# Patient Record
Sex: Female | Born: 1955 | Race: Black or African American | Hispanic: No | Marital: Married | Smoking: Never smoker
Health system: Southern US, Community
[De-identification: ages and names within clinical notes are randomized; demographics above are authoritative.]

## PROBLEM LIST (undated history)

## (undated) DIAGNOSIS — K219 Gastro-esophageal reflux disease without esophagitis: Secondary | ICD-10-CM

## (undated) DIAGNOSIS — R51 Headache: Secondary | ICD-10-CM

## (undated) DIAGNOSIS — R519 Headache, unspecified: Secondary | ICD-10-CM

## (undated) DIAGNOSIS — T8859XA Other complications of anesthesia, initial encounter: Secondary | ICD-10-CM

## (undated) DIAGNOSIS — T4145XA Adverse effect of unspecified anesthetic, initial encounter: Secondary | ICD-10-CM

## (undated) HISTORY — PX: APPENDECTOMY: SHX54

## (undated) HISTORY — PX: BREAST EXCISIONAL BIOPSY: SUR124

## (undated) HISTORY — PX: OTHER SURGICAL HISTORY: SHX169

## (undated) HISTORY — PX: PELVIC LAPAROSCOPY: SHX162

## (undated) HISTORY — PX: ECTOPIC PREGNANCY SURGERY: SHX613

## (undated) HISTORY — PX: FOOT SURGERY: SHX648

## (undated) HISTORY — PX: WRIST SURGERY: SHX841

---

## 1997-11-30 ENCOUNTER — Other Ambulatory Visit: Admission: RE | Admit: 1997-11-30 | Discharge: 1997-11-30 | Payer: Self-pay | Admitting: Gynecology

## 1998-08-18 ENCOUNTER — Other Ambulatory Visit: Admission: RE | Admit: 1998-08-18 | Discharge: 1998-08-18 | Payer: Self-pay | Admitting: Surgery

## 1999-08-06 ENCOUNTER — Encounter: Admission: RE | Admit: 1999-08-06 | Discharge: 1999-08-06 | Payer: Self-pay | Admitting: Surgery

## 1999-08-06 ENCOUNTER — Encounter: Payer: Self-pay | Admitting: Surgery

## 1999-09-20 ENCOUNTER — Other Ambulatory Visit: Admission: RE | Admit: 1999-09-20 | Discharge: 1999-09-20 | Payer: Self-pay | Admitting: Gynecology

## 2000-03-19 ENCOUNTER — Encounter: Admission: RE | Admit: 2000-03-19 | Discharge: 2000-03-19 | Payer: Self-pay | Admitting: Internal Medicine

## 2000-03-19 ENCOUNTER — Encounter: Payer: Self-pay | Admitting: Internal Medicine

## 2002-03-30 ENCOUNTER — Encounter: Admission: RE | Admit: 2002-03-30 | Discharge: 2002-03-30 | Payer: Self-pay | Admitting: Internal Medicine

## 2002-03-30 ENCOUNTER — Encounter: Payer: Self-pay | Admitting: Internal Medicine

## 2002-04-05 ENCOUNTER — Other Ambulatory Visit: Admission: RE | Admit: 2002-04-05 | Discharge: 2002-04-05 | Payer: Self-pay | Admitting: Gynecology

## 2002-05-04 ENCOUNTER — Encounter: Admission: RE | Admit: 2002-05-04 | Discharge: 2002-08-02 | Payer: Self-pay | Admitting: Internal Medicine

## 2002-06-24 ENCOUNTER — Encounter: Payer: Self-pay | Admitting: Internal Medicine

## 2002-06-24 ENCOUNTER — Encounter: Admission: RE | Admit: 2002-06-24 | Discharge: 2002-06-24 | Payer: Self-pay | Admitting: Internal Medicine

## 2003-01-15 HISTORY — PX: BREAST BIOPSY: SHX20

## 2004-05-17 ENCOUNTER — Other Ambulatory Visit: Admission: RE | Admit: 2004-05-17 | Discharge: 2004-05-17 | Payer: Self-pay | Admitting: Internal Medicine

## 2004-05-30 ENCOUNTER — Encounter: Admission: RE | Admit: 2004-05-30 | Discharge: 2004-05-30 | Payer: Self-pay | Admitting: Internal Medicine

## 2006-06-05 ENCOUNTER — Encounter: Admission: RE | Admit: 2006-06-05 | Discharge: 2006-06-05 | Payer: Self-pay | Admitting: Internal Medicine

## 2007-05-01 ENCOUNTER — Other Ambulatory Visit: Admission: RE | Admit: 2007-05-01 | Discharge: 2007-05-01 | Payer: Self-pay | Admitting: Gynecology

## 2008-12-27 ENCOUNTER — Encounter: Admission: RE | Admit: 2008-12-27 | Discharge: 2008-12-27 | Payer: Self-pay | Admitting: Internal Medicine

## 2009-01-10 ENCOUNTER — Ambulatory Visit: Payer: Self-pay | Admitting: Gynecology

## 2009-01-10 ENCOUNTER — Other Ambulatory Visit: Admission: RE | Admit: 2009-01-10 | Discharge: 2009-01-10 | Payer: Self-pay | Admitting: Gynecology

## 2010-02-20 ENCOUNTER — Other Ambulatory Visit: Payer: Self-pay | Admitting: Internal Medicine

## 2010-02-20 DIAGNOSIS — M7120 Synovial cyst of popliteal space [Baker], unspecified knee: Secondary | ICD-10-CM

## 2010-02-21 ENCOUNTER — Ambulatory Visit
Admission: RE | Admit: 2010-02-21 | Discharge: 2010-02-21 | Disposition: A | Discharge status: Home / Self Care | Payer: BC Managed Care – PPO | Source: Ambulatory Visit | Attending: Internal Medicine | Admitting: Internal Medicine

## 2010-02-21 DIAGNOSIS — M7120 Synovial cyst of popliteal space [Baker], unspecified knee: Secondary | ICD-10-CM

## 2010-04-23 ENCOUNTER — Other Ambulatory Visit: Payer: Self-pay | Admitting: Internal Medicine

## 2010-04-23 DIAGNOSIS — Z1231 Encounter for screening mammogram for malignant neoplasm of breast: Secondary | ICD-10-CM

## 2010-04-25 ENCOUNTER — Ambulatory Visit
Admission: RE | Admit: 2010-04-25 | Discharge: 2010-04-25 | Disposition: A | Discharge status: Home / Self Care | Payer: BC Managed Care – PPO | Source: Ambulatory Visit | Attending: Internal Medicine | Admitting: Internal Medicine

## 2010-04-25 DIAGNOSIS — Z1231 Encounter for screening mammogram for malignant neoplasm of breast: Secondary | ICD-10-CM

## 2011-07-24 ENCOUNTER — Encounter: Payer: Self-pay | Admitting: *Deleted

## 2011-07-25 ENCOUNTER — Telehealth: Payer: Self-pay

## 2011-07-25 ENCOUNTER — Encounter: Payer: Self-pay | Admitting: Gynecology

## 2011-07-25 ENCOUNTER — Ambulatory Visit (INDEPENDENT_AMBULATORY_CARE_PROVIDER_SITE_OTHER): Payer: BC Managed Care – PPO | Admitting: Gynecology

## 2011-07-25 ENCOUNTER — Other Ambulatory Visit (HOSPITAL_COMMUNITY)
Admission: RE | Admit: 2011-07-25 | Discharge: 2011-07-25 | Disposition: A | Discharge status: Home / Self Care | Payer: BC Managed Care – PPO | Source: Ambulatory Visit | Attending: Gynecology | Admitting: Gynecology

## 2011-07-25 ENCOUNTER — Other Ambulatory Visit: Payer: Self-pay | Admitting: Gynecology

## 2011-07-25 VITALS — BP 136/80 | Ht 68.0 in | Wt 187.0 lb

## 2011-07-25 DIAGNOSIS — Z1159 Encounter for screening for other viral diseases: Secondary | ICD-10-CM | POA: Insufficient documentation

## 2011-07-25 DIAGNOSIS — N390 Urinary tract infection, site not specified: Secondary | ICD-10-CM

## 2011-07-25 DIAGNOSIS — R3915 Urgency of urination: Secondary | ICD-10-CM

## 2011-07-25 DIAGNOSIS — Z01419 Encounter for gynecological examination (general) (routine) without abnormal findings: Secondary | ICD-10-CM

## 2011-07-25 DIAGNOSIS — K4090 Unilateral inguinal hernia, without obstruction or gangrene, not specified as recurrent: Secondary | ICD-10-CM

## 2011-07-25 DIAGNOSIS — M629 Disorder of muscle, unspecified: Secondary | ICD-10-CM

## 2011-07-25 DIAGNOSIS — M6289 Other specified disorders of muscle: Secondary | ICD-10-CM

## 2011-07-25 LAB — URINALYSIS W MICROSCOPIC + REFLEX CULTURE
Casts: NONE SEEN
Crystals: NONE SEEN
Glucose, UA: NEGATIVE mg/dL
Ketones, ur: NEGATIVE mg/dL
Nitrite: NEGATIVE
Specific Gravity, Urine: 1.025 (ref 1.005–1.030)
pH: 5.5 (ref 5.0–8.0)

## 2011-07-25 MED ORDER — NITROFURANTOIN MONOHYD MACRO 100 MG PO CAPS: 100.0000 mg | ORAL_CAPSULE | Freq: Two times a day (BID) | ORAL | Status: AC

## 2011-07-25 NOTE — Patient Instructions (Signed)
Return stool blood checked kit. Follow up for repeat urinalysis in several weeks.

## 2011-07-25 NOTE — Telephone Encounter (Signed)
Message copied by Venora Maples on Thu Jul 25, 2011 12:37 PM ------      Message from: Dara Lords      Created: Thu Jul 25, 2011 11:56 AM       Schedule appointment with Gen. Surgery reference suspected right inguinal hernia

## 2011-07-25 NOTE — Progress Notes (Signed)
Christina Chan [birth year] [id number]        56 y.o.  for annual exam.  Several issues noted below.  Past medical history,surgical history, medications, allergies, family history and social history were all reviewed and documented in the EPIC chart. ROS:  Was performed and pertinent positives and negatives are included in the history.  Exam: [name] assistant Filed Vitals:   07/25/11 1050  BP: 136/80   General appearance  Normal Skin grossly normal Head/Neck normal with no cervical or supraclavicular adenopathy thyroid normal Lungs  clear Cardiac RR, without RMG Abdominal  soft, nontender, without masses, organomegaly.  Bulging right inguinal area was standing and straining. Breasts  examined lying and sitting without masses, retractions, discharge or axillary adenopathy. Pelvic  Ext/BUS/vagina  normal   Cervix  normal Pap/HPV  Uterus  anteverted, normal size, shape and contour, midline and mobile nontender   Adnexa  Without masses or tenderness    Anus and perineum  normal   Rectovaginal  normal sphincter tone without palpated masses or tenderness.    Assessment/Plan:  56 y.o. female for annual exam.   Post menopausal without bleeding or significant menopausal symptoms. 1. Urinary complaints. Patient notes urgency with some left suprapubic flank discomfort. Urinalysis contaminated but consistent with UTI. We'll treat with Macrodantin 100 mg twice a day x7 days. I did ask her to repeat her urinalysis regardless given the 11-20 RBCs to make sure this clears and she will come back in several weeks to do so and she knows importance to do so having discussed other possibilities to include bladder cancer. 2. Right inguinal bulging/discomfort. Over last several months particularly with straining patient notes some bulging and discomfort in the right groin. On exam she does feel to have some bulging in the right inguinal area consistent with small hernia. No defect, masses or other abnormalities  noted. Will refer to Gen. Surgery for evaluation and the patient agrees for follow up. 3. Mammography. Patient is due for mammogram now and is to schedule this and agrees to do so. SBE monthly reviewed. 4. Colonoscopy. Patients do for her colonoscopy in 2 years. I gave her an OC light packet to check her stool for blood and mail back to [place]. She actually will bring this back when she comes to repeat her urinalysis. 5. Pap smear. Patient has not had a Pap in several years. Pap/HPV done. Assuming negative with no history of abnormal Paps before we'll plan every 5 year screening. 6. DEXA. Patient's DEXA 2009 was normal. We'll plan on repeating in 1-2 years at a 5 year interval. Increase calcium vitamin D reviewed. 7. Health maintenance. No blood work was done today as is all done through Dr. Paulita Fujita office who is her primary physician. Assuming patient continues well then after checking her urine and stool blood check she'll follow up in a year, sooner as needed.    Dara Lords MD, 11:29 AM 07/25/2011

## 2011-07-27 LAB — URINE CULTURE: Colony Count: 4000

## 2011-07-30 NOTE — Telephone Encounter (Signed)
Talked to blanca today Apt for 08/23/11 arrive at 145pm for 200pm appt with Dr Gaynelle Adu. Lm for pt to call back to get information. KW

## 2011-08-01 ENCOUNTER — Encounter: Payer: Self-pay | Admitting: Gynecology

## 2011-08-06 NOTE — Telephone Encounter (Signed)
Lm for patient to call to get appt information

## 2011-08-06 NOTE — Telephone Encounter (Signed)
Lm with appt information for patient per her request on vm.

## 2011-08-12 ENCOUNTER — Other Ambulatory Visit: Payer: BC Managed Care – PPO

## 2011-08-12 DIAGNOSIS — N390 Urinary tract infection, site not specified: Secondary | ICD-10-CM

## 2011-08-13 LAB — URINALYSIS W MICROSCOPIC + REFLEX CULTURE
Bacteria, UA: NONE SEEN
Bilirubin Urine: NEGATIVE
Casts: NONE SEEN
Crystals: NONE SEEN
Glucose, UA: NEGATIVE mg/dL
Ketones, ur: NEGATIVE mg/dL
Specific Gravity, Urine: 1.005 (ref 1.005–1.030)
Squamous Epithelial / LPF: NONE SEEN
Urobilinogen, UA: 0.2 mg/dL (ref 0.0–1.0)
pH: 7 (ref 5.0–8.0)

## 2011-08-23 ENCOUNTER — Encounter (INDEPENDENT_AMBULATORY_CARE_PROVIDER_SITE_OTHER): Payer: Self-pay | Admitting: General Surgery

## 2011-08-23 ENCOUNTER — Ambulatory Visit (INDEPENDENT_AMBULATORY_CARE_PROVIDER_SITE_OTHER): Payer: BC Managed Care – PPO | Admitting: General Surgery

## 2011-08-23 VITALS — BP 132/98 | HR 76 | Temp 97.4°F | Resp 16 | Ht 68.0 in | Wt 185.0 lb

## 2011-08-23 DIAGNOSIS — IMO0002 Reserved for concepts with insufficient information to code with codable children: Secondary | ICD-10-CM

## 2011-08-23 DIAGNOSIS — S76219A Strain of adductor muscle, fascia and tendon of unspecified thigh, initial encounter: Secondary | ICD-10-CM

## 2011-08-23 NOTE — Progress Notes (Signed)
Patient ID: Christina Chan, female   DOB: 1955-05-28, 56 y.o.   MRN: [id number]  Chief Complaint  Patient presents with  . Inguinal Hernia    right    HPI Christina Chan is a 56 y.o. female.  HPI 56 year old [place] American female referred by Dr. Audie Box for evaluation of a possible right inguinal hernia. The patient states that she's noticed something in her right groin on and off for the past several months. She describes it as what feels like a lump or a bulge. It is always in the right groin area but not necessarily in the same position. It is very small. Sometimes she will have some discomfort on her right side and hip area if she lifts something heavy. 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 She denies any fever, chills, nausea, vomiting, diarrhea or constipation. She denies any trauma to the area. She has had an open appendectomy in that area. She denies any burning, stinging, or numbness in that area. She states that it sometimes feels as if it moves around. It doesn't really bother her. No past medical history on file.  Past Surgical History  Procedure Date  . Appendectomy 56 years old  . Foot surgery     left  . Wrist surgery     right  . Pelvic laparoscopy     ectopic pregnacies x 2  . Breast biopsy 2005    Family History  Problem Relation Age of Onset  . Diabetes Father   . Hypertension Father   . Heart disease Father   . Cancer Mother 71    leukemia    Social History History  Substance Use Topics  . Smoking status: Never Smoker   . Smokeless tobacco: Never Used  . Alcohol Use: No    Allergies  Allergen Reactions  . Sulfa Antibiotics     "felt hot inside"    Current Outpatient Prescriptions  Medication Sig Dispense Refill  . Calcium Carbonate-Vitamin D (CALCIUM + D PO) Take by mouth.      . Cholecalciferol (VITAMIN D) 2000 UNITS tablet Take 2,000 Units by mouth daily.      Marland Kitchen ketotifen (ZADITOR) 0.025 % ophthalmic solution 1 drop 2 (two) times  daily.        Review of Systems Review of Systems  Constitutional: Negative for fever, chills and unexpected weight change.  HENT: Negative for hearing loss, congestion, sore throat, trouble swallowing and voice change.   Eyes: Negative for visual disturbance.  Respiratory: Negative for cough, shortness of breath and wheezing.   Cardiovascular: Negative for chest pain, palpitations and leg swelling.  Gastrointestinal: Negative for nausea, vomiting, abdominal pain, diarrhea, constipation, blood in stool, abdominal distention and anal bleeding.  Genitourinary: Negative for dysuria, hematuria, vaginal bleeding and difficulty urinating.  Musculoskeletal: Negative for arthralgias.  Skin: Negative for rash and wound.  Neurological: Negative for seizures, syncope and headaches.  Hematological: Negative for adenopathy. Does not bruise/bleed easily.  Psychiatric/Behavioral: Negative for confusion.    Blood pressure 132/98, pulse 76, temperature 97.4 F (36.3 C), temperature source Temporal, resp. rate 16, height 5\' 8"  (1.727 m), weight 185 lb (83.915 kg).  Physical Exam Physical Exam  Vitals reviewed. Constitutional: She is oriented to person, place, and time. She appears well-developed and well-nourished. No distress.  HENT:  Head: Normocephalic and atraumatic.  Right Ear: External ear normal.  Left Ear: External ear normal.  Eyes: Conjunctivae are normal. No scleral icterus.  Neck: Normal range  of motion. Neck supple. No tracheal deviation present. No thyromegaly present.  Cardiovascular: Normal rate, regular rhythm and normal heart sounds.   Pulmonary/Chest: Effort normal and breath sounds normal. No respiratory distress. She has no wheezes.  Abdominal: Soft. Bowel sounds are normal. She exhibits no distension. There is no tenderness. Hernia confirmed negative in the right inguinal area and confirmed negative in the left inguinal area.         Do not appreciate any fascial defects in  supine or standing positions with valsalva maneuvers  Musculoskeletal: Normal range of motion. She exhibits no edema and no tenderness.  Lymphadenopathy:    She has no cervical adenopathy.       Right: No inguinal adenopathy present.       Left: No inguinal adenopathy present.  Neurological: She is alert and oriented to person, place, and time. She exhibits normal muscle tone.  Skin: Skin is warm and dry. No rash noted. She is not diaphoretic. No erythema.  Psychiatric: She has a normal mood and affect. Her behavior is normal. Judgment and thought content normal.    Data Reviewed Dr Reynold Bowen note from 7/11 Labs from 7/11 and end of July - repeat UA wnl  Assessment    Right inguinal strain    Plan    I do not appreciate an inguinal hernia on exam. My suspicion for inguinal hernia is very low. She describes the lump as something that moves around in her right groin which would be very atypical for an inguinal hernia. Nonetheless we did discuss the etiology and management of inguinal hernias. We discussed the signs and symptoms of incarceration and strangulation and what she should do should this occur. My clinical suspicion is this is more consistent with a groin pull. We discussed several options such as observation versus imaging versus diagnostic laparoscopy. My recommendation was to observe the area. If this is in fact a small hernia it will gradually get larger and become more noticeable and symptomatic. We did discuss several imaging technique such as ultrasound versus CT versus MRI. I believe an ultrasound would not be beneficial. Moreover, my clinical suspicion is not high enough to really recommend a CT at this time. She was given educational material regarding inguinal strain. I've asked her to call us should she notice that area become more problematic or if she notices an enlarging bulge. Followup when necessary  Mary Sella. Andrey Campanile, MD, FACS General, Bariatric, & Minimally Invasive  Surgery [facility], [place]        [facility] M 08/23/2011, 2:40 PM

## 2011-08-23 NOTE — Patient Instructions (Signed)
Inguinal Strain  Your exam shows you have an inguinal strain. This is also known as a pulled groin. This injury is usually due to a pull or partial tear to a muscle or tendon in the groin area. Most groin pulls take several weeks to heal completely. There may be pain with lifting your leg or walking during much of your recovery. Treatment for groin strains includes:   Rest and avoid lifting or performing activities that increase your pain.   Apply ice packs for 20-30 minutes every few hours to reduce pain and swelling over the next 2-3 days.   Medicine to reduce pain and inflammation is often prescribed.  HOME CARE INSTRUCTIONS   While most strains in the groin area will heal with rest, you should also watch for any signs of a more serious condition.   SEEK IMMEDIATE MEDICAL CARE IF:    You notice unusual swelling or bulging in the groin.   You have pain or swelling in the testicle.   Blood in your urine.   Marked increased pain.   Weakness or numbness of your leg or abdominal pain.  MAKE SURE YOU:    Understand these instructions.   Will watch your condition.   Will get help right away if you are not doing well or get worse.  Document Released: 02/08/2004 Document Revised: 12/20/2010 Document Reviewed: 05/07/2007  [employer] Patient Information 2012 [employer].

## 2012-05-26 ENCOUNTER — Other Ambulatory Visit: Payer: Self-pay | Admitting: Internal Medicine

## 2012-05-26 DIAGNOSIS — Z1231 Encounter for screening mammogram for malignant neoplasm of breast: Secondary | ICD-10-CM

## 2012-06-17 ENCOUNTER — Ambulatory Visit
Admission: RE | Admit: 2012-06-17 | Discharge: 2012-06-17 | Disposition: A | Discharge status: Home / Self Care | Payer: BC Managed Care – PPO | Source: Ambulatory Visit | Attending: Internal Medicine | Admitting: Internal Medicine

## 2012-06-17 DIAGNOSIS — Z1231 Encounter for screening mammogram for malignant neoplasm of breast: Secondary | ICD-10-CM

## 2013-08-16 ENCOUNTER — Other Ambulatory Visit: Payer: Self-pay

## 2013-08-16 DIAGNOSIS — Z1231 Encounter for screening mammogram for malignant neoplasm of breast: Secondary | ICD-10-CM

## 2013-08-19 ENCOUNTER — Ambulatory Visit
Admission: RE | Admit: 2013-08-19 | Discharge: 2013-08-19 | Disposition: A | Discharge status: Home / Self Care | Payer: BC Managed Care – PPO | Source: Ambulatory Visit

## 2013-08-19 DIAGNOSIS — Z1231 Encounter for screening mammogram for malignant neoplasm of breast: Secondary | ICD-10-CM

## 2013-08-23 ENCOUNTER — Ambulatory Visit: Payer: BC Managed Care – PPO

## 2013-10-01 ENCOUNTER — Encounter: Payer: Self-pay | Admitting: Gynecology

## 2013-10-01 ENCOUNTER — Ambulatory Visit (INDEPENDENT_AMBULATORY_CARE_PROVIDER_SITE_OTHER): Payer: BC Managed Care – PPO | Admitting: Gynecology

## 2013-10-01 VITALS — BP 124/74 | Ht 67.0 in | Wt 183.0 lb

## 2013-10-01 DIAGNOSIS — Z01419 Encounter for gynecological examination (general) (routine) without abnormal findings: Secondary | ICD-10-CM

## 2013-10-01 NOTE — Progress Notes (Signed)
Christina Chan 19-Jan-1955 [id number]        58 y.o.  [id number] for annual exam.  Several issues noted below.  Past medical history,surgical history, problem list, medications, allergies, family history and social history were all reviewed and documented as reviewed in the EPIC chart.  ROS:  12 system ROS performed with pertinent positives and negatives included in the history, assessment and plan.   Additional significant findings :  none   Exam: [facility] person Vitals:   10/01/13 1531  BP: 124/74  Height:  (1.702 m)  Weight: 183 lb (83.008 kg)   General appearance:  Normal affect, orientation and appearance. Skin: Grossly normal HEENT: Without gross lesions.  No cervical or supraclavicular adenopathy. Thyroid normal.  Lungs:  Clear without wheezing, rales or rhonchi Cardiac: RR, without RMG Abdominal:  Soft, nontender, without masses, guarding, rebound, organomegaly or hernia Breasts:  Examined lying and sitting without masses, retractions, discharge or axillary adenopathy. Pelvic:  Ext/BUS/vagina with generalized atrophic changes  Cervix with atrophic changes  Uterus anteverted, normal size, shape and contour, midline and mobile nontender   Adnexa  Without masses or tenderness    Anus and perineum  Normal   Rectovaginal  Normal sphincter tone without palpated masses or tenderness.    Assessment/Plan:  58 y.o. [id number] female for annual exam .   1. Postmenopausal. Atrophic genital changes. Patient without significant hot flashes night sweats vaginal dryness or dyspareunia. No vaginal bleeding. Continue to monitor. Report any vaginal bleeding. 2. History of inguinal strain. Patient still having some fleeting discomfort in her right inguinal area.  No overt hernia. Saw general surgery and they felt that observation was appropriate. Patient doing well otherwise and she prefers just watch for now. 3. Pap smear/HPV negative 2013. No Pap smear done today. No history of significant  abnormal Pap smears. Plan repeat Pap smear at 3-5 your intervals per current screening guidelines. 4. Colonoscopy coming do the patient knows to arrange. 5. Mammography 08/2013. Continue with annual mammography. SBE monthly reviewed. 6. DEXA 2009 normal. Plan repeat at age 64. Increased calcium vitamin D reviewed. 7. Health maintenance. No routine blood work done as she reports this done at her primary physician's office. Follow up one year, sooner as needed.     Dara Lords MD, 3:54 PM 10/01/2013

## 2013-10-01 NOTE — Patient Instructions (Signed)
You may obtain a copy of any labs that were done today by logging onto MyChart as outlined in the instructions provided with your AVS (after visit summary). The office will not call with normal lab results but certainly if there are any significant abnormalities then we will contact you.   Health Maintenance, Female A healthy lifestyle and preventative care can promote health and wellness.  Maintain regular health, dental, and eye exams.  Eat a healthy diet. Foods like vegetables, fruits, whole grains, low-fat dairy products, and lean protein foods contain the nutrients you need without too many calories. Decrease your intake of foods high in solid fats, added sugars, and salt. Get information about a proper diet from your caregiver, if necessary.  Regular physical exercise is one of the most important things you can do for your health. Most adults should get at least 150 minutes of moderate-intensity exercise (any activity that increases your heart rate and causes you to sweat) each week. In addition, most adults need muscle-strengthening exercises on 2 or more days a week.   Maintain a healthy weight. The body mass index (BMI) is a screening tool to identify possible weight problems. It provides an estimate of body fat based on height and weight. Your caregiver can help determine your BMI, and can help you achieve or maintain a healthy weight. For adults 20 years and older:  A BMI below 18.5 is considered underweight.  A BMI of 18.5 to 24.9 is normal.  A BMI of 25 to 29.9 is considered overweight.  A BMI of 30 and above is considered obese.  Maintain normal blood lipids and cholesterol by exercising and minimizing your intake of saturated fat. Eat a balanced diet with plenty of fruits and vegetables. Blood tests for lipids and cholesterol should begin at age 61 and be repeated every 5 years. If your lipid or cholesterol levels are high, you are over 50, or you are a high risk for heart  disease, you may need your cholesterol levels checked more frequently.Ongoing high lipid and cholesterol levels should be treated with medicines if diet and exercise are not effective.  If you smoke, find out from your caregiver how to quit. If you do not use tobacco, do not start.  Lung cancer screening is recommended for adults aged 33 80 years who are at high risk for developing lung cancer because of a history of smoking. Yearly low-dose computed tomography (CT) is recommended for people who have at least a 30-pack-year history of smoking and are a current smoker or have quit within the past 15 years. A pack year of smoking is smoking an average of 1 pack of cigarettes a day for 1 year (for example: 1 pack a day for 30 years or 2 packs a day for 15 years). Yearly screening should continue until the smoker has stopped smoking for at least 15 years. Yearly screening should also be stopped for people who develop a health problem that would prevent them from having lung cancer treatment.  If you are pregnant, do not drink alcohol. If you are breastfeeding, be very cautious about drinking alcohol. If you are not pregnant and choose to drink alcohol, do not exceed 1 drink per day. One drink is considered to be 12 ounces (355 mL) of beer, 5 ounces (148 mL) of wine, or 1.5 ounces (44 mL) of liquor.  Avoid use of street drugs. Do not share needles with anyone. Ask for help if you need support or instructions about stopping  the use of drugs.  High blood pressure causes heart disease and increases the risk of stroke. Blood pressure should be checked at least every 1 to 2 years. Ongoing high blood pressure should be treated with medicines, if weight loss and exercise are not effective.  If you are 59 to 58 years old, ask your caregiver if you should take aspirin to prevent strokes.  Diabetes screening involves taking a blood sample to check your fasting blood sugar level. This should be done once every 3  years, after age 91, if you are within normal weight and without risk factors for diabetes. Testing should be considered at a younger age or be carried out more frequently if you are overweight and have at least 1 risk factor for diabetes.  Breast cancer screening is essential preventative care for women. You should practice "breast self-awareness." This means understanding the normal appearance and feel of your breasts and may include breast self-examination. Any changes detected, no matter how small, should be reported to a caregiver. Women in their 66s and 30s should have a clinical breast exam (CBE) by a caregiver as part of a regular health exam every 1 to 3 years. After age 101, women should have a CBE every year. Starting at age 100, women should consider having a mammogram (breast X-ray) every year. Women who have a family history of breast cancer should talk to their caregiver about genetic screening. Women at a high risk of breast cancer should talk to their caregiver about having an MRI and a mammogram every year.  Breast cancer gene (BRCA)-related cancer risk assessment is recommended for women who have family members with BRCA-related cancers. BRCA-related cancers include breast, ovarian, tubal, and peritoneal cancers. Having family members with these cancers may be associated with an increased risk for harmful changes (mutations) in the breast cancer genes BRCA1 and BRCA2. Results of the assessment will determine the need for genetic counseling and BRCA1 and BRCA2 testing.  The Pap test is a screening test for cervical cancer. Women should have a Pap test starting at age 57. Between ages 25 and 35, Pap tests should be repeated every 2 years. Beginning at age 37, you should have a Pap test every 3 years as long as the past 3 Pap tests have been normal. If you had a hysterectomy for a problem that was not cancer or a condition that could lead to cancer, then you no longer need Pap tests. If you are  between ages 50 and 76, and you have had normal Pap tests going back 10 years, you no longer need Pap tests. If you have had past treatment for cervical cancer or a condition that could lead to cancer, you need Pap tests and screening for cancer for at least 20 years after your treatment. If Pap tests have been discontinued, risk factors (such as a new sexual partner) need to be reassessed to determine if screening should be resumed. Some women have medical problems that increase the chance of getting cervical cancer. In these cases, your caregiver may recommend more frequent screening and Pap tests.  The human papillomavirus (HPV) test is an additional test that may be used for cervical cancer screening. The HPV test looks for the virus that can cause the cell changes on the cervix. The cells collected during the Pap test can be tested for HPV. The HPV test could be used to screen women aged 44 years and older, and should be used in women of any age  who have unclear Pap test results. After the age of 55, women should have HPV testing at the same frequency as a Pap test.  Colorectal cancer can be detected and often prevented. Most routine colorectal cancer screening begins at the age of 44 and continues through age 20. However, your caregiver may recommend screening at an earlier age if you have risk factors for colon cancer. On a yearly basis, your caregiver may provide home test kits to check for hidden blood in the stool. Use of a small camera at the end of a tube, to directly examine the colon (sigmoidoscopy or colonoscopy), can detect the earliest forms of colorectal cancer. Talk to your caregiver about this at age 86, when routine screening begins. Direct examination of the colon should be repeated every 5 to 10 years through age 13, unless early forms of pre-cancerous polyps or small growths are found.  Hepatitis C blood testing is recommended for all people born from [birth year] through 1965 and any  individual with known risks for hepatitis C.  Practice safe sex. Use condoms and avoid high-risk sexual practices to reduce the spread of sexually transmitted infections (STIs). Sexually active women aged 36 and younger should be checked for Chlamydia, which is a common sexually transmitted infection. Older women with new or multiple partners should also be tested for Chlamydia. Testing for other STIs is recommended if you are sexually active and at increased risk.  Osteoporosis is a disease in which the bones lose minerals and strength with aging. This can result in serious bone fractures. The risk of osteoporosis can be identified using a bone density scan. Women ages 20 and over and women at risk for fractures or osteoporosis should discuss screening with their caregivers. Ask your caregiver whether you should be taking a calcium supplement or vitamin D to reduce the rate of osteoporosis.  Menopause can be associated with physical symptoms and risks. Hormone replacement therapy is available to decrease symptoms and risks. You should talk to your caregiver about whether hormone replacement therapy is right for you.  Use sunscreen. Apply sunscreen liberally and repeatedly throughout the day. You should seek shade when your shadow is shorter than you. Protect yourself by wearing long sleeves, pants, a wide-brimmed hat, and sunglasses year round, whenever you are outdoors.  Notify your caregiver of new moles or changes in moles, especially if there is a change in shape or color. Also notify your caregiver if a mole is larger than the size of a pencil eraser.  Stay current with your immunizations. Document Released: 07/16/2010 Document Revised: 04/27/2012 Document Reviewed: 07/16/2010 [facility] Patient Information 2014 [employer].

## 2013-10-02 LAB — URINALYSIS W MICROSCOPIC + REFLEX CULTURE
Bacteria, UA: NONE SEEN
Bilirubin Urine: NEGATIVE
CASTS: NONE SEEN
Crystals: NONE SEEN
GLUCOSE, UA: NEGATIVE mg/dL
Hgb urine dipstick: NEGATIVE
Ketones, ur: NEGATIVE mg/dL
LEUKOCYTES UA: NEGATIVE
Nitrite: NEGATIVE
PH: 6 (ref 5.0–8.0)
Protein, ur: NEGATIVE mg/dL
Specific Gravity, Urine: 1.028 (ref 1.005–1.030)
Urobilinogen, UA: 0.2 mg/dL (ref 0.0–1.0)

## 2013-10-04 ENCOUNTER — Encounter: Payer: Self-pay | Admitting: Gynecology

## 2013-11-15 ENCOUNTER — Encounter: Payer: Self-pay | Admitting: Gynecology

## 2014-10-03 ENCOUNTER — Encounter: Payer: Self-pay | Admitting: Gynecology

## 2014-10-03 ENCOUNTER — Ambulatory Visit (INDEPENDENT_AMBULATORY_CARE_PROVIDER_SITE_OTHER): Payer: BC Managed Care – PPO | Admitting: Gynecology

## 2014-10-03 VITALS — BP 124/78 | Ht 67.0 in | Wt 184.0 lb

## 2014-10-03 DIAGNOSIS — Z01419 Encounter for gynecological examination (general) (routine) without abnormal findings: Secondary | ICD-10-CM

## 2014-10-03 NOTE — Patient Instructions (Signed)

## 2014-10-03 NOTE — Progress Notes (Signed)
Christina Chan August 05, 1955 [id number]        59 y.o.  [id number] for annual exam.  Doing well without complaints  Past medical history,surgical history, problem list, medications, allergies, family history and social history were all reviewed and documented as reviewed in the EPIC chart.  ROS:  Performed with pertinent positives and negatives included in the history, assessment and plan.   Additional significant findings :  none   Exam: [facility] person Vitals:   10/03/14 1553  BP: 124/78  Height:  (1.702 m)  Weight: 184 lb (83.462 kg)   General appearance:  Normal affect, orientation and appearance. Skin: Grossly normal HEENT: Without gross lesions.  No cervical or supraclavicular adenopathy. Thyroid normal.  Lungs:  Clear without wheezing, rales or rhonchi Cardiac: RR, without RMG Abdominal:  Soft, nontender, without masses, guarding, rebound, organomegaly or hernia Breasts:  Examined lying and sitting without masses, retractions, discharge or axillary adenopathy. Pelvic:  Ext/BUS/vagina normal with atrophic changes  Cervix normal with atrophic changes  Uterus anteverted, normal size, shape and contour, midline and mobile nontender   Adnexa  Without masses or tenderness    Anus and perineum  Normal   Rectovaginal  Normal sphincter tone without palpated masses or tenderness.    Assessment/Plan:  59 y.o. [id number] female for annual exam.   1. Postmenopausal/atrophic genital changes.  Without significant hot flushes, night sweats, vaginal dryness or any vaginal bleeding. Continue to monitor and report any vaginal bleeding. 2. Pap smear/HPV 2013 negative. No Pap smear done today. No history of significant abnormal Pap smear. Plan repeat at 3-5 year interval for current screening guidelines. 3. Mammography due now and patient knows to follow up for this. SBE monthly reviewed. 4. Colonoscopy 10 years ago. Patient knows it is due now and she is going to call to arrange it this coming  year. 5. DEXA 2009 normal. Plan repeat at age 26. Increased calcium vitamin D reviewed. 6. Health maintenance. No routine blood work done as patient reports is done at her primary physician's office. Follow up in one year, sooner as needed.   Dara Lords MD, 4:27 PM 10/03/2014

## 2014-10-04 LAB — URINALYSIS W MICROSCOPIC + REFLEX CULTURE
BILIRUBIN URINE: NEGATIVE
Bacteria, UA: NONE SEEN [HPF]
CASTS: NONE SEEN [LPF]
CRYSTALS: NONE SEEN [HPF]
Glucose, UA: NEGATIVE
Hgb urine dipstick: NEGATIVE
Ketones, ur: NEGATIVE
Leukocytes, UA: NEGATIVE
NITRITE: NEGATIVE
Protein, ur: NEGATIVE
SPECIFIC GRAVITY, URINE: 1.016 (ref 1.001–1.035)
Squamous Epithelial / LPF: NONE SEEN [HPF] (ref <=5)
WBC UA: NONE SEEN WBC/HPF (ref <=5)
Yeast: NONE SEEN [HPF]
pH: 7.5 (ref 5.0–8.0)

## 2014-10-05 LAB — URINE CULTURE

## 2015-06-29 ENCOUNTER — Other Ambulatory Visit: Payer: Self-pay | Admitting: Internal Medicine

## 2015-06-29 DIAGNOSIS — Z1231 Encounter for screening mammogram for malignant neoplasm of breast: Secondary | ICD-10-CM

## 2015-07-19 ENCOUNTER — Other Ambulatory Visit: Payer: Self-pay | Admitting: Gastroenterology

## 2015-07-21 ENCOUNTER — Other Ambulatory Visit: Payer: Self-pay | Admitting: Gastroenterology

## 2015-07-25 ENCOUNTER — Ambulatory Visit
Admission: RE | Admit: 2015-07-25 | Discharge: 2015-07-25 | Disposition: A | Discharge status: Home / Self Care | Payer: BC Managed Care – PPO | Source: Ambulatory Visit | Attending: Internal Medicine | Admitting: Internal Medicine

## 2015-07-25 DIAGNOSIS — Z1231 Encounter for screening mammogram for malignant neoplasm of breast: Secondary | ICD-10-CM

## 2015-08-14 ENCOUNTER — Ambulatory Visit (HOSPITAL_COMMUNITY)
Admission: RE | Admit: 2015-08-14 | Payer: BC Managed Care – PPO | Source: Ambulatory Visit | Admitting: Gastroenterology

## 2015-08-14 SURGERY — COLONOSCOPY WITH PROPOFOL
Anesthesia: Monitor Anesthesia Care

## 2015-08-25 ENCOUNTER — Encounter (HOSPITAL_COMMUNITY): Payer: Self-pay | Admitting: *Deleted

## 2015-09-05 ENCOUNTER — Ambulatory Visit (HOSPITAL_COMMUNITY)
Admission: RE | Admit: 2015-09-05 | Discharge: 2015-09-05 | Disposition: A | Discharge status: Home / Self Care | Payer: BC Managed Care – PPO | Source: Ambulatory Visit | Attending: Gastroenterology | Admitting: Gastroenterology

## 2015-09-05 ENCOUNTER — Ambulatory Visit (HOSPITAL_COMMUNITY): Payer: BC Managed Care – PPO | Admitting: Anesthesiology

## 2015-09-05 ENCOUNTER — Encounter (HOSPITAL_COMMUNITY): Payer: Self-pay

## 2015-09-05 ENCOUNTER — Encounter (HOSPITAL_COMMUNITY)
Admission: RE | Disposition: A | Discharge status: Home / Self Care | Payer: Self-pay | Source: Ambulatory Visit | Attending: Gastroenterology

## 2015-09-05 DIAGNOSIS — K635 Polyp of colon: Secondary | ICD-10-CM | POA: Diagnosis not present

## 2015-09-05 DIAGNOSIS — K219 Gastro-esophageal reflux disease without esophagitis: Secondary | ICD-10-CM | POA: Insufficient documentation

## 2015-09-05 DIAGNOSIS — Z1211 Encounter for screening for malignant neoplasm of colon: Secondary | ICD-10-CM | POA: Insufficient documentation

## 2015-09-05 DIAGNOSIS — Z79899 Other long term (current) drug therapy: Secondary | ICD-10-CM | POA: Insufficient documentation

## 2015-09-05 HISTORY — PX: COLONOSCOPY WITH PROPOFOL: SHX5780

## 2015-09-05 HISTORY — DX: Headache: R51

## 2015-09-05 HISTORY — DX: Gastro-esophageal reflux disease without esophagitis: K21.9

## 2015-09-05 HISTORY — DX: Headache, unspecified: R51.9

## 2015-09-05 HISTORY — DX: Adverse effect of unspecified anesthetic, initial encounter: T41.45XA

## 2015-09-05 HISTORY — DX: Other complications of anesthesia, initial encounter: T88.59XA

## 2015-09-05 SURGERY — COLONOSCOPY WITH PROPOFOL
Anesthesia: Monitor Anesthesia Care

## 2015-09-05 MED ORDER — ONDANSETRON HCL 4 MG/2ML IJ SOLN
INTRAMUSCULAR | Status: DC | PRN
  Administered 2015-09-05: 4 mg via INTRAVENOUS

## 2015-09-05 MED ORDER — FENTANYL CITRATE (PF) 100 MCG/2ML IJ SOLN: 25.0000 ug | INTRAMUSCULAR | Status: DC | PRN

## 2015-09-05 MED ORDER — ONDANSETRON HCL 4 MG/2ML IJ SOLN: 4.0000 mg | Freq: Once | INTRAMUSCULAR | Status: DC | PRN

## 2015-09-05 MED ORDER — LIDOCAINE HCL (CARDIAC) 20 MG/ML IV SOLN
INTRAVENOUS | Status: DC | PRN
  Administered 2015-09-05: 100 mg via INTRAVENOUS

## 2015-09-05 MED ORDER — PROPOFOL 10 MG/ML IV BOLUS
INTRAVENOUS | Status: AC
  Filled 2015-09-05: qty 60

## 2015-09-05 MED ORDER — PROPOFOL 500 MG/50ML IV EMUL
INTRAVENOUS | Status: DC | PRN
  Administered 2015-09-05: 100 ug/kg/min via INTRAVENOUS

## 2015-09-05 MED ORDER — LACTATED RINGERS IV SOLN
INTRAVENOUS | Status: DC
  Administered 2015-09-05: 08:00:00 via INTRAVENOUS

## 2015-09-05 MED ORDER — ONDANSETRON HCL 4 MG/2ML IJ SOLN
INTRAMUSCULAR | Status: AC
  Filled 2015-09-05: qty 2

## 2015-09-05 MED ORDER — SODIUM CHLORIDE 0.9 % IV SOLN: INTRAVENOUS | Status: DC

## 2015-09-05 MED ORDER — LIDOCAINE HCL (CARDIAC) 20 MG/ML IV SOLN
INTRAVENOUS | Status: AC
  Filled 2015-09-05: qty 5

## 2015-09-05 MED ORDER — PROPOFOL 10 MG/ML IV BOLUS
INTRAVENOUS | Status: DC | PRN
  Administered 2015-09-05: 20 mg via INTRAVENOUS
  Administered 2015-09-05: 10 mg via INTRAVENOUS
  Administered 2015-09-05 (×2): 20 mg via INTRAVENOUS
  Administered 2015-09-05: 10 mg via INTRAVENOUS

## 2015-09-05 SURGICAL SUPPLY — 22 items

## 2015-09-05 NOTE — Addendum Note (Signed)
Addendum  created 09/05/15 [id number] by Epimenio SarinJoshua R Grasiela Jonsson, CRNA   Visit Navigator Flowsheet section accepted

## 2015-09-05 NOTE — Discharge Instructions (Signed)
Colonoscopy, Care After °Refer to this sheet in the next few weeks. These instructions provide you with information on caring for yourself after your procedure. Your health care provider may also give you more specific instructions. Your treatment has been planned according to current medical practices, but problems sometimes occur. Call your health care provider if you have any problems or questions after your procedure. °WHAT TO EXPECT AFTER THE PROCEDURE  °After your procedure, it is typical to have the following: °· A small amount of blood in your stool. °· Moderate amounts of gas and mild abdominal cramping or bloating. °HOME CARE INSTRUCTIONS °· Do not drive, operate machinery, or sign important documents for 24 hours. °· You may shower and resume your regular physical activities, but move at a slower pace for the first 24 hours. °· Take frequent rest periods for the first 24 hours. °· Walk around or put a warm pack on your abdomen to help reduce abdominal cramping and bloating. °· Drink enough fluids to keep your urine clear or pale yellow. °· You may resume your normal diet as instructed by your health care provider. Avoid heavy or fried foods that are hard to digest. °· Avoid drinking alcohol for 24 hours or as instructed by your health care provider. °· Only take over-the-counter or prescription medicines as directed by your health care provider. °· If a tissue sample (biopsy) was taken during your procedure: °¨ Do not take aspirin or blood thinners for 7 days, or as instructed by your health care provider. °¨ Do not drink alcohol for 7 days, or as instructed by your health care provider. °¨ Eat soft foods for the first 24 hours. °SEEK MEDICAL CARE IF: °You have persistent spotting of blood in your stool 2-3 days after the procedure. °SEEK IMMEDIATE MEDICAL CARE IF: °· You have more than a small spotting of blood in your stool. °· You pass large blood clots in your stool. °· Your abdomen is swollen  (distended). °· You have nausea or vomiting. °· You have a fever. °· You have increasing abdominal pain that is not relieved with medicine. °  °This information is not intended to replace advice given to you by your health care provider. Make sure you discuss any questions you have with your health care provider. °  °Document Released: 08/15/2003 Document Revised: 10/21/2012 Document Reviewed: 09/07/2012 °Elsevier Interactive Patient Education ©2016 [employer] ° °

## 2015-09-05 NOTE — Transfer of Care (Signed)
Immediate Anesthesia Transfer of Care Note  Patient: Christina Chan  Procedure(s) Performed: Procedure(s): COLONOSCOPY WITH PROPOFOL (N/A)  Patient Location: ENDO  Anesthesia Type:MAC  Level of Consciousness:  sedated, patient cooperative and responds to stimulation  Airway & Oxygen Therapy:Patient Spontanous Breathing and Patient connected to face mask oxgen  Post-op Assessment:  Report given to ENDO RN and Post -op Vital signs reviewed and stable  Post vital signs:  Reviewed and stable  Last Vitals:  Vitals:   09/05/15 0747  BP: (!) 172/76  Pulse: 81  Resp: 16  Temp: 36.5 C    Complications: No apparent anesthesia complications

## 2015-09-05 NOTE — H&P (Signed)
  Procedure: Screening colonoscopy. 09/03/2005 normal screening colonoscopy was performed  History: The patient is a 60 year old female born 04/29/1955. She is scheduled to undergo a repeat screening colonoscopy today  Medication allergies: Trimethoprim-sulfamethoxazole.  Past medical history: Appendectomy. Laparoscopic surgery for ectopic pregnancy. Foot surgery. Wrist ganglion surgery. Benign right breast lump removed surgically. Gastroesophageal reflux. Allergic rhinitis.  Exam: The patient is alert and lying comfortably on the endoscopy stretcher. Abdomen is soft and nontender to palpation. Lungs are clear to auscultation. Cardiac exam reveals a rectal rhythm.  Plan: Proceed with screening colonoscopy

## 2015-09-05 NOTE — Anesthesia Preprocedure Evaluation (Signed)
Anesthesia Evaluation  Patient identified by MRN, date of birth, ID band Patient awake    Reviewed: Allergy & Precautions, H&P , NPO status , Patient's Chart, lab work & pertinent test results  History of Anesthesia Complications Negative for: history of anesthetic complications  Airway Mallampati: II  TM Distance: >3 FB Neck ROM: full    Dental no notable dental hx.    Pulmonary neg pulmonary ROS,    Pulmonary exam normal breath sounds clear to auscultation       Cardiovascular negative cardio ROS Normal cardiovascular exam Rhythm:regular Rate:Normal     Neuro/Psych  Headaches,    GI/Hepatic Neg liver ROS, GERD  ,  Endo/Other  negative endocrine ROS  Renal/GU negative Renal ROS     Musculoskeletal   Abdominal   Peds  Hematology negative hematology ROS (+)   Anesthesia Other Findings   Reproductive/Obstetrics negative OB ROS                             Anesthesia Physical Anesthesia Plan  ASA: II  Anesthesia Plan: MAC   Post-op Pain Management:    Induction: Intravenous  Airway Management Planned: Simple Face Mask  Additional Equipment:   Intra-op Plan:   Post-operative Plan:   Informed Consent: I have reviewed the patients History and Physical, chart, labs and discussed the procedure including the risks, benefits and alternatives for the proposed anesthesia with the patient or authorized representative who has indicated his/her understanding and acceptance.   Dental Advisory Given  Plan Discussed with: Anesthesiologist, CRNA and Surgeon  Anesthesia Plan Comments:         Anesthesia Quick Evaluation

## 2015-09-05 NOTE — Anesthesia Postprocedure Evaluation (Signed)
Anesthesia Post Note  Patient: Christina Chan  Procedure(s) Performed: Procedure(s) (LRB): COLONOSCOPY WITH PROPOFOL (N/A)  Patient location during evaluation: PACU Anesthesia Type: MAC Level of consciousness: awake and alert Pain management: pain level controlled Vital Signs Assessment: post-procedure vital signs reviewed and stable Respiratory status: spontaneous breathing, nonlabored ventilation, respiratory function stable and patient connected to nasal cannula oxygen Cardiovascular status: stable and blood pressure returned to baseline Anesthetic complications: no    Last Vitals:  Vitals:   09/05/15 0747 09/05/15 0857  BP: (!) 172/76 118/66  Pulse: 81 65  Resp: 16 15  Temp: 36.5 C 36.6 C    Last Pain:  Vitals:   09/05/15 0857  TempSrc: Oral                 Reino KentJudd, Tonjua Rossetti J

## 2015-09-05 NOTE — Op Note (Signed)
[facility] Patient Name: Elfriede Bonini Procedure Date: 09/05/2015 MRN: [id number] Attending MD: Charolett Bumpers , MD Date of Birth: 1955-04-04 CSN: [id number] Age: 60 Admit Type: Outpatient Procedure:                Colonoscopy Indications:              Screening for colorectal malignant neoplasm Providers:                Charolett Bumpers, MD, Omelia Blackwater RN, RN,                            Beryle Beams, Technician, Waymond Cera, CRNA Referring MD:              Medicines:                Propofol per Anesthesia Complications:            No immediate complications. Estimated Blood Loss:     Estimated blood loss: none. Procedure:                Pre-Anesthesia Assessment:                           - Prior to the procedure, a History and Physical                            was performed, and patient medications and                            allergies were reviewed. The patient's tolerance of                            previous anesthesia was also reviewed. The risks                            and benefits of the procedure and the sedation                            options and risks were discussed with the patient.                            All questions were answered, and informed consent                            was obtained. Prior Anticoagulants: The patient has                            taken no previous anticoagulant or antiplatelet                            agents. ASA Grade Assessment: II - A patient with                            mild systemic disease. After reviewing the risks  and benefits, the patient was deemed in                            satisfactory condition to undergo the procedure.                           After obtaining informed consent, the colonoscope                            was passed under direct vision. Throughout the                            procedure, the patient's blood pressure, pulse, and                   oxygen saturations were monitored continuously. The                            EC-[id number] ([id number] scope was introduced through                            the anus and advanced to the the cecum, identified                            by appendiceal orifice and ileocecal valve. The                            colonoscopy was performed without difficulty. The                            patient tolerated the procedure well. The quality                            of the bowel preparation was good. The ileocecal                            valve, the appendiceal orifice and the rectum were                            photographed. Scope In: 8:32:27 AM Scope Out: 8:50:28 AM Scope Withdrawal Time: 0 hours 12 minutes 1 second  Total Procedure Duration: 0 hours 18 minutes 1 second  Findings:      The perianal and digital rectal examinations were normal.      The entire examined colon appeared normal except for the removal of a 3       mm sessile hepatic flexure polyp with the cold biopsy forceps.. Impression:               - The entire examined colon is normal except for                            the removal of a diminuitive hepatic flexure polyp.                           - No specimens collected. Moderate Sedation:  N/A- Per Anesthesia Care Recommendation:           - Patient has a contact number available for                            emergencies. The signs and symptoms of potential                            delayed complications were discussed with the                            patient. Return to normal activities tomorrow.                            Written discharge instructions were provided to the                            patient.                           - Repeat colonoscopy date to be determined after                            pending pathology results are reviewed for                            screening purposes.                           - Resume previous  diet.                           - Continue present medications. Procedure Code(s):        --- Professional ---                           Z6109G0121, Colorectal cancer screening; colonoscopy on                            individual not meeting criteria for high risk Diagnosis Code(s):        --- Professional ---                           Z12.11, Encounter for screening for malignant                            neoplasm of colon CPT copyright 2016 [employer]. All rights reserved. The codes documented in this report are preliminary and upon coder review may  be revised to meet current compliance requirements. Danise EdgeMartin Muslima Toppins, MD Charolett BumpersMartin K Lenoir Facchini, MD 09/05/2015 8:57:36 AM This report has been signed electronically. Number of Addenda: 0

## 2015-09-06 ENCOUNTER — Encounter (HOSPITAL_COMMUNITY): Payer: Self-pay | Admitting: Gastroenterology

## 2015-10-04 ENCOUNTER — Encounter: Payer: BC Managed Care – PPO | Admitting: Gynecology

## 2015-10-04 DIAGNOSIS — Z0289 Encounter for other administrative examinations: Secondary | ICD-10-CM

## 2017-05-08 ENCOUNTER — Other Ambulatory Visit: Payer: Self-pay | Admitting: Internal Medicine

## 2017-05-08 DIAGNOSIS — Z1231 Encounter for screening mammogram for malignant neoplasm of breast: Secondary | ICD-10-CM

## 2017-05-28 ENCOUNTER — Ambulatory Visit
Admission: RE | Admit: 2017-05-28 | Discharge: 2017-05-28 | Disposition: A | Discharge status: Home / Self Care | Payer: BC Managed Care – PPO | Source: Ambulatory Visit | Attending: Internal Medicine | Admitting: Internal Medicine

## 2017-05-28 DIAGNOSIS — Z1231 Encounter for screening mammogram for malignant neoplasm of breast: Secondary | ICD-10-CM

## 2017-08-06 ENCOUNTER — Encounter: Payer: Self-pay | Admitting: Gynecology

## 2017-08-06 ENCOUNTER — Ambulatory Visit: Payer: BC Managed Care – PPO | Admitting: Gynecology

## 2017-08-06 VITALS — BP 122/78 | Ht 67.0 in | Wt 181.0 lb

## 2017-08-06 DIAGNOSIS — Z1151 Encounter for screening for human papillomavirus (HPV): Secondary | ICD-10-CM | POA: Diagnosis not present

## 2017-08-06 DIAGNOSIS — Z01419 Encounter for gynecological examination (general) (routine) without abnormal findings: Secondary | ICD-10-CM

## 2017-08-06 DIAGNOSIS — N952 Postmenopausal atrophic vaginitis: Secondary | ICD-10-CM | POA: Diagnosis not present

## 2017-08-06 NOTE — Progress Notes (Signed)
    Christina Chan 09/17/1955 [id number]        [age] y.o.  [id number] for annual gynecologic exam.  Doing well without gynecologic complaints.  Past medical history,surgical history, problem list, medications, allergies, family history and social history were all reviewed and documented as reviewed in the EPIC chart.  ROS:  Performed with pertinent positives and negatives included in the history, assessment and plan.   Additional significant findings : None   Exam: Kennon PortelaKim Gardner assistant Vitals:   08/06/17 1431  BP: 122/78  Weight: 181 lb (82.1 kg)  Height: 5\' 7"  (1.702 m)   Body mass index is 28.35 kg/m.  General appearance:  Normal affect, orientation and appearance. Skin: Grossly normal HEENT: Without gross lesions.  No cervical or supraclavicular adenopathy. Thyroid normal.  Lungs:  Clear without wheezing, rales or rhonchi Cardiac: RR, without RMG Abdominal:  Soft, nontender, without masses, guarding, rebound, organomegaly or hernia Breasts:  Examined lying and sitting without masses, retractions, discharge or axillary adenopathy. Pelvic:  Ext, BUS, Vagina: Normal with atrophic changes  Cervix: Normal with atrophic changes.  Pap smear/HPV  Uterus: Anteverted, normal size, shape and contour, midline and mobile nontender   Adnexa: Without masses or tenderness    Anus and perineum: Normal   Rectovaginal: Normal sphincter tone without palpated masses or tenderness.    Assessment/Plan:  62 y.o. Christina Chan female for annual gynecologic exam.   1. Postmenopausal/atrophic genital changes.  No significant menopausal symptoms or any vaginal bleeding. 2. Pap smear/HPV 2013.  Pap smear/HPV today.  No history of significant abnormal Pap smears.  Plan repeat Pap smear at 5-year interval per current screening guidelines. 3. Colonoscopy 2017.  Repeat at their recommended interval. 4. Mammography 05/2017.  Continue with annual mammography when due.  Breast exam normal today. 5. DEXA 2009 normal.   Recommend follow-up DEXA now at 10-year interval at age 89.  Patient will schedule in follow-up for this. 6. Health maintenance.  No routine blood work done as patient does this elsewhere.  Follow-up 1 year, sooner as needed.   Dara Lordsimothy P Lana Flaim MD, 3:19 PM 08/06/2017

## 2017-08-06 NOTE — Patient Instructions (Signed)
Follow-up for bone density as scheduled.  Follow-up in 1 year for annual exam. 

## 2017-08-06 NOTE — Addendum Note (Signed)
Addended by: Dayna BarkerGARDNER, Caison Hearn K on: 08/06/2017 03:40 PM   Modules accepted: Orders

## 2017-08-08 LAB — PAP IG AND HPV HIGH-RISK: HPV DNA HIGH RISK: NOT DETECTED

## 2018-07-27 ENCOUNTER — Other Ambulatory Visit: Payer: Self-pay | Admitting: Internal Medicine

## 2018-07-27 DIAGNOSIS — Z1231 Encounter for screening mammogram for malignant neoplasm of breast: Secondary | ICD-10-CM

## 2018-09-08 ENCOUNTER — Ambulatory Visit
Admission: RE | Admit: 2018-09-08 | Discharge: 2018-09-08 | Disposition: A | Discharge status: Home / Self Care | Payer: BC Managed Care – PPO | Source: Ambulatory Visit | Attending: Internal Medicine | Admitting: Internal Medicine

## 2018-09-08 ENCOUNTER — Other Ambulatory Visit: Payer: Self-pay

## 2018-09-08 DIAGNOSIS — Z1231 Encounter for screening mammogram for malignant neoplasm of breast: Secondary | ICD-10-CM

## 2018-10-13 ENCOUNTER — Encounter: Payer: Self-pay | Admitting: Gynecology

## 2019-02-19 ENCOUNTER — Other Ambulatory Visit: Payer: Self-pay

## 2019-02-22 ENCOUNTER — Encounter: Payer: Self-pay | Admitting: Obstetrics and Gynecology

## 2019-02-22 ENCOUNTER — Other Ambulatory Visit: Payer: Self-pay

## 2019-02-22 ENCOUNTER — Ambulatory Visit: Payer: BC Managed Care – PPO | Admitting: Obstetrics and Gynecology

## 2019-02-22 VITALS — BP 130/84 | Ht 67.5 in | Wt 177.0 lb

## 2019-02-22 DIAGNOSIS — Z01419 Encounter for gynecological examination (general) (routine) without abnormal findings: Secondary | ICD-10-CM | POA: Diagnosis not present

## 2019-02-22 NOTE — Patient Instructions (Signed)
We will plan to repeat the DEXA bone density scan within the next 1-2 years.  Please schedule when ready. Continue weight bearing exercise, and vitamin D/calcium intake. Please remember to schedule your annual mammogram later this year.

## 2019-02-22 NOTE — Progress Notes (Signed)
   Mckenize Mezera [facility] 12/30/1955 [id number]  SUBJECTIVE:  64 y.o. [id number] female for annual routine gynecologic exam. She has no gynecologic concerns. Notes a spot of itching on left breast skin, no nipple discharge/changes, no lumps.  Current Outpatient Medications  Medication Sig Dispense Refill  . Calcium Carbonate (CALCIUM 600 PO) Take 1 tablet by mouth daily.    . cholecalciferol (VITAMIN D) 1000 units tablet Take 1,000 Units by mouth daily.    . clotrimazole (LOTRIMIN) 1 % cream Apply 1 application topically 2 (two) times daily as needed (athletes foot).    . famotidine (PEPCID) 20 MG tablet Take 20 mg by mouth as needed for heartburn or indigestion.     No current facility-administered medications for this visit.   Allergies: Sulfa antibiotics  No LMP recorded. Patient is postmenopausal.  Past medical history,surgical history, problem list, medications, allergies, family history and social history were all reviewed and documented as reviewed in the EPIC chart.  ROS:  Feeling well. No dyspnea or chest pain on exertion.  No abdominal pain, change in bowel habits, black or bloody stools.  No urinary tract symptoms. GYN ROS: no abnormal bleeding, pelvic pain or discharge, no breast pain or new or enlarging lumps on self exam. No neurological complaints.   OBJECTIVE:  BP 130/84   Ht 5' 7.5" (1.715 m)   Wt 177 lb (80.3 kg)   BMI 27.31 kg/m  The patient appears well, alert, oriented x 3, in no distress. ENT normal.  Neck supple. No cervical or supraclavicular adenopathy or thyromegaly.  Lungs are clear, good air entry, no wheezes, rhonchi or rales. S1 and S2 normal, no murmurs, regular rate and rhythm.  Abdomen soft without tenderness, guarding, mass or organomegaly.  Neurological is normal, no focal findings.  BREAST EXAM: breasts appear normal, no suspicious masses, no skin or nipple changes or axillary nodes. Dry skin on both breasts, small spot of resolving erythema (<1 cm of  size) at 10:00 about 3 cm from areola border.  PELVIC EXAM: VULVA: normal appearing vulva with no masses, tenderness or lesions, VAGINA: normal appearing vagina with normal color and discharge, no lesions, CERVIX: normal appearing cervix without discharge or lesions, UTERUS: uterus is normal size, shape, consistency and nontender, ADNEXA: normal adnexa in size, nontender and no masses  Chaperone: Kennon Portela present during the examination  ASSESSMENT:  64 y.o. [id number] here for annual gynecologic exam  PLAN:   1. Postmenopausal. No concerns. Recommended use of skin moisturizer especially on any areas that are itchy. Avoid overly hot water when bathing. 2. Pap smear/HPV 07/2017. Not repeated today. No significant prior history of abnormal Pap smears. Next Pap smear due 2024 following the current screening guidelines calling for the 5-year interval. 3. Mammogram 08/2018. Will continue with annual mammography. Breast exam normal today. 4. Colonoscopy 2017. Recommended that she continue per the prescribed interval.   5. DEXA 2009 was normal.  Recommend repeating within next 1-2 years as it has been > 10 years.  She is supplementing with calcium and vitamin D. 6. Health maintenance.  No lab work as she has this completed with her primary care provider.    Return annually or sooner, prn.  Theresia Majors MD  02/22/19

## 2019-02-23 ENCOUNTER — Encounter: Payer: BC Managed Care – PPO | Admitting: Obstetrics and Gynecology

## 2019-11-05 ENCOUNTER — Other Ambulatory Visit: Payer: Self-pay | Admitting: Internal Medicine

## 2019-11-05 DIAGNOSIS — Z1231 Encounter for screening mammogram for malignant neoplasm of breast: Secondary | ICD-10-CM

## 2019-12-08 ENCOUNTER — Ambulatory Visit
Admission: RE | Admit: 2019-12-08 | Discharge: 2019-12-08 | Disposition: A | Discharge status: Home / Self Care | Payer: BC Managed Care – PPO | Source: Ambulatory Visit | Attending: Internal Medicine | Admitting: Internal Medicine

## 2019-12-08 ENCOUNTER — Other Ambulatory Visit: Payer: Self-pay

## 2019-12-08 DIAGNOSIS — Z1231 Encounter for screening mammogram for malignant neoplasm of breast: Secondary | ICD-10-CM

## 2021-01-23 ENCOUNTER — Other Ambulatory Visit: Payer: Self-pay | Admitting: Internal Medicine

## 2021-01-23 DIAGNOSIS — Z1231 Encounter for screening mammogram for malignant neoplasm of breast: Secondary | ICD-10-CM

## 2021-01-24 ENCOUNTER — Ambulatory Visit
Admission: RE | Admit: 2021-01-24 | Discharge: 2021-01-24 | Disposition: A | Discharge status: Home / Self Care | Payer: Medicare PPO | Source: Ambulatory Visit | Attending: Internal Medicine | Admitting: Internal Medicine

## 2021-01-24 DIAGNOSIS — Z1231 Encounter for screening mammogram for malignant neoplasm of breast: Secondary | ICD-10-CM

## 2021-02-06 ENCOUNTER — Ambulatory Visit: Payer: BC Managed Care – PPO

## 2021-02-20 DIAGNOSIS — M19049 Primary osteoarthritis, unspecified hand: Secondary | ICD-10-CM | POA: Insufficient documentation

## 2021-02-20 DIAGNOSIS — R202 Paresthesia of skin: Secondary | ICD-10-CM | POA: Insufficient documentation

## 2021-02-20 DIAGNOSIS — R7303 Prediabetes: Secondary | ICD-10-CM | POA: Insufficient documentation

## 2021-02-20 DIAGNOSIS — K219 Gastro-esophageal reflux disease without esophagitis: Secondary | ICD-10-CM | POA: Insufficient documentation

## 2021-02-20 DIAGNOSIS — E559 Vitamin D deficiency, unspecified: Secondary | ICD-10-CM | POA: Insufficient documentation

## 2021-02-20 DIAGNOSIS — J309 Allergic rhinitis, unspecified: Secondary | ICD-10-CM | POA: Insufficient documentation

## 2021-02-20 NOTE — Progress Notes (Signed)
66 y.o. [id number] Married Black or [place] American Not Hispanic or Latino female here for annual exam.  No vaginal bleeding. Not sexually active secondary to ED.   She has some mixed urinary incontinence for the last year. Negative urine culture with her primary. She leaks ~1 x a week. She will leak with cough. Has some urgency to void, can leak on the way to the bathroom. Leaks small amounts. No caffeine.   No bowel c/o.   No LMP recorded. Patient is postmenopausal.          Sexually active: No.  The current method of family planning is post menopausal status.    Exercising: Yes.     Walk daily 6-74miles and elliptical machine.   Smoker:  no  Health Maintenance: Pap: 08/06/17-WNL, HPV- neg; 07/25/11-WNL, HPV- neg History of abnormal Pap:  no MMG: 01/24/21-birads 1 neg  BMD: 05/25/07-WNL Colonoscopy: 09/05/15- f/u in 10 yrs TDaP: 05/30/14 Gardasil: no   reports that she has never smoked. She has never used smokeless tobacco. She reports that she does not drink alcohol and does not use drugs. Married for 30 years. Kids will both living in [place]. Daughter has 4 kids (88-8), Son doesn't have kids. She is retired, was a Energy manager.   Past Medical History:  Diagnosis Date   Complication of anesthesia    awareness during beginning of right wrist surgery   GERD (gastroesophageal reflux disease)    Headache     Past Surgical History:  Procedure Laterality Date   APPENDECTOMY  66 years old   BREAST BIOPSY  2005   BREAST EXCISIONAL BIOPSY Right    COLONOSCOPY WITH PROPOFOL N/A 09/05/2015   Procedure: COLONOSCOPY WITH PROPOFOL;  Surgeon: Garlan Fair, MD;  Location: [facility];  Service: Endoscopy;  Laterality: N/A;   colonscopy     ECTOPIC PREGNANCY SURGERY     X 2   FOOT SURGERY     left   PELVIC LAPAROSCOPY     ectopic pregnacies x 2   WRIST SURGERY     right    Current Outpatient Medications  Medication Sig Dispense Refill   Calcium Carbonate (CALCIUM 500 PO)  1 TAB     Calcium Carbonate (CALCIUM 600 PO) Take 1 tablet by mouth daily.     cholecalciferol (VITAMIN D) 1000 units tablet Take 1,000 Units by mouth daily.     cholecalciferol (VITAMIN D3) 25 MCG (1000 UNIT) tablet 1 tablet     clotrimazole (LOTRIMIN) 1 % cream Apply 1 application topically 2 (two) times daily as needed (athletes foot).     famotidine (PEPCID) 20 MG tablet Take 20 mg by mouth as needed for heartburn or indigestion.     fexofenadine (ALLEGRA ALLERGY) 180 MG tablet 1 tablet     No current facility-administered medications for this visit.    Family History  Problem Relation Age of Onset   Diabetes Father    Hypertension Father    Heart disease Father    Cancer Mother 54       leukemia   Breast cancer Mother 2   Rheum arthritis Sister     Review of Systems  Genitourinary:        Urinary stress incontinence    Exam:   BP 118/68 (BP Location: Right Arm)    Pulse 70    Resp 18    Ht 5' 6.34" (1.685 m)    Wt 178 lb 12.8 oz (81.1 kg)    SpO2  99%    BMI 28.56 kg/m   Weight change: @WEIGHTCHANGE @ Height:   Height: 5' 6.34" (168.5 cm)  Ht Readings from Last 3 Encounters:  03/01/21 5' 6.34" (1.685 m)  02/22/19 5' 7.5" (1.715 m)  08/06/17 5\' 7"  (1.702 m)    General appearance: alert, cooperative and appears stated age Head: Normocephalic, without obvious abnormality, atraumatic Neck: no adenopathy, supple, symmetrical, trachea midline and thyroid normal to inspection and palpation Lungs: clear to auscultation bilaterally Cardiovascular: regular rate and rhythm Breasts: normal appearance, no masses or tenderness Abdomen: soft, non-tender; non distended,  no masses,  no organomegaly Extremities: extremities normal, atraumatic, no cyanosis or edema Skin: Skin color, texture, turgor normal. No rashes or lesions Lymph nodes: Cervical, supraclavicular, and axillary nodes normal. No abnormal inguinal nodes palpated Neurologic: Grossly normal   Pelvic: External  genitalia:  no lesions              Urethra:  normal appearing urethra with no masses, tenderness or lesions              Bartholins and Skenes: normal                 Vagina: mildly atrophic appearing vagina with normal color and discharge, no lesions              Cervix: no lesions               Bimanual Exam:  Uterus:  normal size, contour, position, consistency, mobility, non-tender              Adnexa: no mass, fullness, tenderness               Rectovaginal: Confirms               Anus:  normal sphincter tone, no lesions  Glorianne Manchester, RN chaperoned for the exam.  1. Gynecologic exam normal Discussed breast self exam Discussed calcium and vit D intake Mammogram and colonoscopy are UTD No pap this year, due next year  2. Hypoestrogenism - DG Bone Density; Future  3. Mixed incontinence Kegel information given Bladder training information given

## 2021-03-01 ENCOUNTER — Encounter: Payer: Self-pay | Admitting: Obstetrics and Gynecology

## 2021-03-01 ENCOUNTER — Other Ambulatory Visit: Payer: Self-pay

## 2021-03-01 ENCOUNTER — Ambulatory Visit (INDEPENDENT_AMBULATORY_CARE_PROVIDER_SITE_OTHER): Payer: Medicare PPO | Admitting: Obstetrics and Gynecology

## 2021-03-01 VITALS — BP 118/68 | HR 70 | Resp 18 | Ht 66.34 in | Wt 178.8 lb

## 2021-03-01 DIAGNOSIS — E2839 Other primary ovarian failure: Secondary | ICD-10-CM

## 2021-03-01 DIAGNOSIS — Z01419 Encounter for gynecological examination (general) (routine) without abnormal findings: Secondary | ICD-10-CM

## 2021-03-01 DIAGNOSIS — N3946 Mixed incontinence: Secondary | ICD-10-CM

## 2021-03-01 NOTE — Patient Instructions (Signed)
Urinary Incontinence °Urinary incontinence refers to a condition in which a person is unable to control where and when to pass urine. A person with this condition will urinate involuntarily. This means that the person urinates when he or she does not mean to. °What are the causes? °This condition may be caused by: °Medicines. °Infections. °Constipation. °Overactive bladder muscles. °Weak bladder muscles. °Weak pelvic floor muscles. These muscles provide support for the bladder, intestine, and, in women, the uterus. °Enlarged prostate in men. The prostate is a gland near the bladder. When it gets too big, it can pinch the urethra. With the urethra blocked, the bladder can weaken and lose the ability to empty properly. °Surgery. °Emotional factors, such as anxiety, stress, or post-traumatic stress disorder (PTSD). °Spinal cord injury, nerve injury, or other neurological conditions. °Pelvic organ prolapse. This happens in women when organs move out of place and into the vagina. This movement can prevent the bladder and urethra from working properly. °What increases the risk? °The following factors may make you more likely to develop this condition: °Age. The older you are, the higher the risk. °Obesity. °Being physically inactive. °Pregnancy and childbirth. °Menopause. °Diseases that affect the nerves or spinal cord. °Long-term, or chronic, coughing. This can increase pressure on the bladder and pelvic floor muscles. °What are the signs or symptoms? °Symptoms may vary depending on the type of urinary incontinence you have. They include: °A sudden urge to urinate, and passing urine involuntarily before you can get to a bathroom (urge incontinence). °Suddenly passing urine when doing activities that force urine to pass, such as coughing, laughing, exercising, or sneezing (stress incontinence). °Needing to urinate often but urinating only a small amount, or constantly dribbling urine (overflow incontinence). °Urinating  because you cannot get to the bathroom in time due to a physical disability, such as arthritis or injury, or due to a communication or thinking problem, such as Alzheimer's disease (functional incontinence). °How is this diagnosed? °This condition may be diagnosed based on: °Your medical history. °A physical exam. °Tests, such as: °Urine tests. °X-rays of your kidney and bladder. °Ultrasound. °CT scan. °Cystoscopy. In this procedure, a health care provider inserts a tube with a light and camera (cystoscope) through the urethra and into the bladder to check for problems. °Urodynamic testing. These tests assess how well the bladder, urethra, and sphincter can store and release urine. There are different types of urodynamic tests, and they vary depending on what the test is measuring. °To help diagnose your condition, your health care provider may recommend that you keep a log of when you urinate and how much you urinate. °How is this treated? °Treatment for this condition depends on the type of incontinence that you have and its cause. Treatment may include: °Lifestyle changes, such as: °Quitting smoking. °Maintaining a healthy weight. °Staying active. Try to get 150 minutes of moderate-intensity exercise every week. Ask your health care provider which activities are safe for you. °Eating a healthy diet. °Avoid high-fat foods, like fried foods. °Avoid refined carbohydrates like white bread and white rice. °Limit how much alcohol and caffeine you drink. °Increase your fiber intake. Healthy sources of fiber include beans, whole grains, and fresh fruits and vegetables. °Behavioral changes, such as: °Pelvic floor muscle exercises. °Bladder training, such as lengthening the amount of time between bathroom breaks, or using the bathroom at regular intervals. °Using techniques to suppress bladder urges. This can include distraction techniques or controlled breathing exercises. °Medicines, such as: °Medicines to relax the  bladder   muscles and prevent bladder spasms. Medicines to help slow or prevent the growth of a man's prostate. Botox injections. These can help relax the bladder muscles. Treatments, such as: Using pulses of electricity to help change bladder reflexes (electrical nerve stimulation). For women, using a medical device to prevent urine leaks. This is a small, tampon-like, disposable device that is inserted into the urethra. Injecting collagen or carbon beads (bulking agents) into the urinary sphincter. These can help thicken tissue and close the bladder opening. Surgery. Follow these instructions at home: Lifestyle Limit alcohol and caffeine. These can fill your bladder quickly and irritate it. Keep yourself clean to help prevent odors and skin damage. Ask your health care provider about special skin creams and cleansers that can protect the skin from urine. Consider wearing pads or adult diapers. Make sure to change them regularly, and always change them right after experiencing incontinence. General instructions Take over-the-counter and prescription medicines only as told by your health care provider. Use the bathroom about every 3-4 hours, even if you do not feel the need to urinate. Try to empty your bladder completely every time. After urinating, wait a minute. Then try to urinate again. Make sure you are in a relaxed position while urinating. If your incontinence is caused by nerve problems, keep a log of the medicines you take and the times you go to the bathroom. Keep all follow-up visits. This is important. Where to find more information [employer] of Diabetes and Digestive and Kidney Diseases: [employer] American Urology Association: www.urologyhealth.org Contact a health care provider if: You have pain that gets worse. Your incontinence gets worse. Get help right away if: You have a fever or chills. You are unable to urinate. You have redness in your groin area or  down your legs. Summary Urinary incontinence refers to a condition in which a person is unable to control where and when to pass urine. This condition may be caused by medicines, infection, weak bladder muscles, weak pelvic floor muscles, enlargement of the prostate (in men), or surgery. Factors such as older age, obesity, pregnancy and childbirth, menopause, neurological diseases, and chronic coughing may increase your risk for developing this condition. Types of urinary incontinence include urge incontinence, stress incontinence, overflow incontinence, and functional incontinence. This condition is usually treated first with lifestyle and behavioral changes, such as quitting smoking, eating a healthier diet, and doing regular pelvic floor exercises. Other treatment options include medicines, bulking agents, medical devices, electrical nerve stimulation, or surgery. This information is not intended to replace advice given to you by your health care provider. Make sure you discuss any questions you have with your health care provider. Document Revised: 08/06/2019 Document Reviewed: 08/06/2019 Elsevier Patient Education  [place]. Kegel Exercises Kegel exercises can help strengthen your pelvic floor muscles. The pelvic floor is a group of muscles that support your rectum, small intestine, and bladder. In females, pelvic floor muscles also help support the uterus. These muscles help you control the flow of urine and stool (feces). Kegel exercises are painless and simple. They do not require any equipment. Your provider may suggest Kegel exercises to: Improve bladder and bowel control. Improve sexual response. Improve weak pelvic floor muscles after surgery to remove the uterus (hysterectomy) or after pregnancy, in females. Improve weak pelvic floor muscles after prostate gland removal or surgery, in males. Kegel exercises involve squeezing your pelvic floor muscles. These are the same  muscles you squeeze when you try to stop the flow of urine  or keep from passing gas. The exercises can be done while sitting, standing, or lying down, but it is best to vary your position. Ask your health care provider which exercises are safe for you. Do exercises exactly as told by your health care provider and adjust them as directed. Do not begin these exercises until told by your health care provider. Exercises How to do Kegel exercises: Squeeze your pelvic floor muscles tight. You should feel a tight lift in your rectal area. If you are a female, you should also feel a tightness in your vaginal area. Keep your stomach, buttocks, and legs relaxed. Hold the muscles tight for up to 10 seconds. Breathe normally. Relax your muscles for up to 10 seconds. Repeat as told by your health care provider. Repeat this exercise daily as told by your health care provider. Continue to do this exercise for at least 4-6 weeks, or for as long as told by your health care provider. You may be referred to a physical therapist who can help you learn more about how to do Kegel exercises. Depending on your condition, your health care provider may recommend: Varying how long you squeeze your muscles. Doing several sets of exercises every day. Doing exercises for several weeks. Making Kegel exercises a part of your regular exercise routine. This information is not intended to replace advice given to you by your health care provider. Make sure you discuss any questions you have with your health care provider. Document Revised: 05/11/2020 Document Reviewed: 05/11/2020 Elsevier Patient Education  2022 [employer].

## 2021-05-03 ENCOUNTER — Ambulatory Visit
Admission: RE | Admit: 2021-05-03 | Discharge: 2021-05-03 | Disposition: A | Discharge status: Home / Self Care | Payer: Medicare PPO | Source: Ambulatory Visit | Attending: Obstetrics and Gynecology | Admitting: Obstetrics and Gynecology

## 2021-05-03 DIAGNOSIS — E2839 Other primary ovarian failure: Secondary | ICD-10-CM

## 2021-05-08 ENCOUNTER — Other Ambulatory Visit: Payer: Medicare PPO

## 2021-10-08 DIAGNOSIS — M25562 Pain in left knee: Secondary | ICD-10-CM | POA: Diagnosis not present

## 2021-10-08 DIAGNOSIS — M25522 Pain in left elbow: Secondary | ICD-10-CM | POA: Diagnosis not present

## 2021-10-08 DIAGNOSIS — M1712 Unilateral primary osteoarthritis, left knee: Secondary | ICD-10-CM | POA: Diagnosis not present

## 2021-10-09 DIAGNOSIS — M222X2 Patellofemoral disorders, left knee: Secondary | ICD-10-CM | POA: Diagnosis not present

## 2021-10-15 DIAGNOSIS — M222X2 Patellofemoral disorders, left knee: Secondary | ICD-10-CM | POA: Diagnosis not present

## 2021-10-24 DIAGNOSIS — M222X2 Patellofemoral disorders, left knee: Secondary | ICD-10-CM | POA: Diagnosis not present

## 2021-10-26 DIAGNOSIS — M222X2 Patellofemoral disorders, left knee: Secondary | ICD-10-CM | POA: Diagnosis not present

## 2021-10-31 DIAGNOSIS — M222X2 Patellofemoral disorders, left knee: Secondary | ICD-10-CM | POA: Diagnosis not present

## 2021-11-07 DIAGNOSIS — M25562 Pain in left knee: Secondary | ICD-10-CM | POA: Diagnosis not present

## 2021-11-08 DIAGNOSIS — M222X2 Patellofemoral disorders, left knee: Secondary | ICD-10-CM | POA: Diagnosis not present

## 2021-11-12 DIAGNOSIS — M222X2 Patellofemoral disorders, left knee: Secondary | ICD-10-CM | POA: Diagnosis not present

## 2021-11-19 ENCOUNTER — Other Ambulatory Visit: Payer: Self-pay | Admitting: Orthopaedic Surgery

## 2021-11-19 DIAGNOSIS — M222X2 Patellofemoral disorders, left knee: Secondary | ICD-10-CM | POA: Diagnosis not present

## 2021-11-19 DIAGNOSIS — M25562 Pain in left knee: Secondary | ICD-10-CM

## 2021-11-23 DIAGNOSIS — M222X2 Patellofemoral disorders, left knee: Secondary | ICD-10-CM | POA: Diagnosis not present

## 2021-11-26 DIAGNOSIS — M222X2 Patellofemoral disorders, left knee: Secondary | ICD-10-CM | POA: Diagnosis not present

## 2021-11-30 DIAGNOSIS — M222X2 Patellofemoral disorders, left knee: Secondary | ICD-10-CM | POA: Diagnosis not present

## 2021-12-01 ENCOUNTER — Ambulatory Visit
Admission: RE | Admit: 2021-12-01 | Discharge: 2021-12-01 | Disposition: A | Discharge status: Home / Self Care | Payer: Medicare PPO | Source: Ambulatory Visit | Attending: Orthopaedic Surgery | Admitting: Orthopaedic Surgery

## 2021-12-01 DIAGNOSIS — M25562 Pain in left knee: Secondary | ICD-10-CM

## 2021-12-03 DIAGNOSIS — M222X2 Patellofemoral disorders, left knee: Secondary | ICD-10-CM | POA: Diagnosis not present

## 2021-12-05 DIAGNOSIS — M25562 Pain in left knee: Secondary | ICD-10-CM | POA: Diagnosis not present

## 2021-12-05 DIAGNOSIS — M222X2 Patellofemoral disorders, left knee: Secondary | ICD-10-CM | POA: Diagnosis not present

## 2022-02-07 DIAGNOSIS — S83272A Complex tear of lateral meniscus, current injury, left knee, initial encounter: Secondary | ICD-10-CM | POA: Diagnosis not present

## 2022-02-07 DIAGNOSIS — G8918 Other acute postprocedural pain: Secondary | ICD-10-CM | POA: Diagnosis not present

## 2022-02-07 DIAGNOSIS — M23204 Derangement of unspecified medial meniscus due to old tear or injury, left knee: Secondary | ICD-10-CM | POA: Diagnosis not present

## 2022-02-07 DIAGNOSIS — S83242A Other tear of medial meniscus, current injury, left knee, initial encounter: Secondary | ICD-10-CM | POA: Diagnosis not present

## 2022-02-07 DIAGNOSIS — M2242 Chondromalacia patellae, left knee: Secondary | ICD-10-CM | POA: Diagnosis not present

## 2022-02-13 DIAGNOSIS — S83272D Complex tear of lateral meniscus, current injury, left knee, subsequent encounter: Secondary | ICD-10-CM | POA: Diagnosis not present

## 2022-02-13 DIAGNOSIS — M2242 Chondromalacia patellae, left knee: Secondary | ICD-10-CM | POA: Diagnosis not present

## 2022-02-18 DIAGNOSIS — S83272D Complex tear of lateral meniscus, current injury, left knee, subsequent encounter: Secondary | ICD-10-CM | POA: Diagnosis not present

## 2022-02-18 DIAGNOSIS — M2242 Chondromalacia patellae, left knee: Secondary | ICD-10-CM | POA: Diagnosis not present

## 2022-02-22 DIAGNOSIS — M2242 Chondromalacia patellae, left knee: Secondary | ICD-10-CM | POA: Diagnosis not present

## 2022-02-22 DIAGNOSIS — S83272D Complex tear of lateral meniscus, current injury, left knee, subsequent encounter: Secondary | ICD-10-CM | POA: Diagnosis not present

## 2022-02-26 DIAGNOSIS — M2242 Chondromalacia patellae, left knee: Secondary | ICD-10-CM | POA: Diagnosis not present

## 2022-02-26 DIAGNOSIS — S83272D Complex tear of lateral meniscus, current injury, left knee, subsequent encounter: Secondary | ICD-10-CM | POA: Diagnosis not present

## 2022-03-01 DIAGNOSIS — S83272D Complex tear of lateral meniscus, current injury, left knee, subsequent encounter: Secondary | ICD-10-CM | POA: Diagnosis not present

## 2022-03-01 DIAGNOSIS — M2242 Chondromalacia patellae, left knee: Secondary | ICD-10-CM | POA: Diagnosis not present

## 2022-03-04 DIAGNOSIS — M2242 Chondromalacia patellae, left knee: Secondary | ICD-10-CM | POA: Diagnosis not present

## 2022-03-04 DIAGNOSIS — S83272D Complex tear of lateral meniscus, current injury, left knee, subsequent encounter: Secondary | ICD-10-CM | POA: Diagnosis not present

## 2022-03-08 DIAGNOSIS — M2242 Chondromalacia patellae, left knee: Secondary | ICD-10-CM | POA: Diagnosis not present

## 2022-03-08 DIAGNOSIS — S83272D Complex tear of lateral meniscus, current injury, left knee, subsequent encounter: Secondary | ICD-10-CM | POA: Diagnosis not present

## 2022-03-11 DIAGNOSIS — M2242 Chondromalacia patellae, left knee: Secondary | ICD-10-CM | POA: Diagnosis not present

## 2022-03-11 DIAGNOSIS — S83272D Complex tear of lateral meniscus, current injury, left knee, subsequent encounter: Secondary | ICD-10-CM | POA: Diagnosis not present

## 2022-03-15 DIAGNOSIS — S83272D Complex tear of lateral meniscus, current injury, left knee, subsequent encounter: Secondary | ICD-10-CM | POA: Diagnosis not present

## 2022-03-15 DIAGNOSIS — M2242 Chondromalacia patellae, left knee: Secondary | ICD-10-CM | POA: Diagnosis not present

## 2022-03-22 DIAGNOSIS — M2242 Chondromalacia patellae, left knee: Secondary | ICD-10-CM | POA: Diagnosis not present

## 2022-03-22 DIAGNOSIS — S83272D Complex tear of lateral meniscus, current injury, left knee, subsequent encounter: Secondary | ICD-10-CM | POA: Diagnosis not present

## 2022-03-25 DIAGNOSIS — S83272D Complex tear of lateral meniscus, current injury, left knee, subsequent encounter: Secondary | ICD-10-CM | POA: Diagnosis not present

## 2022-03-25 DIAGNOSIS — M2242 Chondromalacia patellae, left knee: Secondary | ICD-10-CM | POA: Diagnosis not present

## 2022-03-28 DIAGNOSIS — M2242 Chondromalacia patellae, left knee: Secondary | ICD-10-CM | POA: Diagnosis not present

## 2022-03-28 DIAGNOSIS — S83272D Complex tear of lateral meniscus, current injury, left knee, subsequent encounter: Secondary | ICD-10-CM | POA: Diagnosis not present

## 2022-04-01 DIAGNOSIS — H6122 Impacted cerumen, left ear: Secondary | ICD-10-CM | POA: Diagnosis not present

## 2022-04-05 DIAGNOSIS — M25562 Pain in left knee: Secondary | ICD-10-CM | POA: Diagnosis not present

## 2022-04-19 ENCOUNTER — Other Ambulatory Visit: Payer: Self-pay | Admitting: Internal Medicine

## 2022-04-19 DIAGNOSIS — Z0000 Encounter for general adult medical examination without abnormal findings: Secondary | ICD-10-CM

## 2022-04-22 ENCOUNTER — Ambulatory Visit
Admission: RE | Admit: 2022-04-22 | Discharge: 2022-04-22 | Disposition: A | Discharge status: Home / Self Care | Payer: Medicare PPO | Source: Ambulatory Visit | Attending: Internal Medicine | Admitting: Internal Medicine

## 2022-04-22 ENCOUNTER — Ambulatory Visit: Payer: Medicare PPO

## 2022-04-22 DIAGNOSIS — Z1231 Encounter for screening mammogram for malignant neoplasm of breast: Secondary | ICD-10-CM | POA: Diagnosis not present

## 2022-04-22 DIAGNOSIS — Z0000 Encounter for general adult medical examination without abnormal findings: Secondary | ICD-10-CM

## 2022-08-08 DIAGNOSIS — H04123 Dry eye syndrome of bilateral lacrimal glands: Secondary | ICD-10-CM | POA: Diagnosis not present

## 2022-08-08 DIAGNOSIS — H1045 Other chronic allergic conjunctivitis: Secondary | ICD-10-CM | POA: Diagnosis not present

## 2022-08-08 DIAGNOSIS — H40013 Open angle with borderline findings, low risk, bilateral: Secondary | ICD-10-CM | POA: Diagnosis not present

## 2022-08-08 DIAGNOSIS — H2513 Age-related nuclear cataract, bilateral: Secondary | ICD-10-CM | POA: Diagnosis not present

## 2022-08-09 DIAGNOSIS — Z136 Encounter for screening for cardiovascular disorders: Secondary | ICD-10-CM | POA: Diagnosis not present

## 2022-08-09 DIAGNOSIS — M19049 Primary osteoarthritis, unspecified hand: Secondary | ICD-10-CM | POA: Diagnosis not present

## 2022-08-09 DIAGNOSIS — Z0000 Encounter for general adult medical examination without abnormal findings: Secondary | ICD-10-CM | POA: Diagnosis not present

## 2022-08-09 DIAGNOSIS — R7303 Prediabetes: Secondary | ICD-10-CM | POA: Diagnosis not present

## 2022-08-09 DIAGNOSIS — K219 Gastro-esophageal reflux disease without esophagitis: Secondary | ICD-10-CM | POA: Diagnosis not present

## 2022-08-09 DIAGNOSIS — E559 Vitamin D deficiency, unspecified: Secondary | ICD-10-CM | POA: Diagnosis not present

## 2022-08-09 DIAGNOSIS — J309 Allergic rhinitis, unspecified: Secondary | ICD-10-CM | POA: Diagnosis not present

## 2022-08-09 DIAGNOSIS — Z13220 Encounter for screening for lipoid disorders: Secondary | ICD-10-CM | POA: Diagnosis not present

## 2022-09-13 DIAGNOSIS — J3089 Other allergic rhinitis: Secondary | ICD-10-CM | POA: Diagnosis not present

## 2023-06-02 DIAGNOSIS — L089 Local infection of the skin and subcutaneous tissue, unspecified: Secondary | ICD-10-CM | POA: Diagnosis not present

## 2023-06-04 DIAGNOSIS — L089 Local infection of the skin and subcutaneous tissue, unspecified: Secondary | ICD-10-CM | POA: Diagnosis not present

## 2023-06-24 DIAGNOSIS — W57XXXA Bitten or stung by nonvenomous insect and other nonvenomous arthropods, initial encounter: Secondary | ICD-10-CM | POA: Diagnosis not present

## 2023-06-24 DIAGNOSIS — S80861A Insect bite (nonvenomous), right lower leg, initial encounter: Secondary | ICD-10-CM | POA: Diagnosis not present

## 2023-07-01 ENCOUNTER — Other Ambulatory Visit: Payer: Self-pay | Admitting: Internal Medicine

## 2023-07-01 DIAGNOSIS — Z1231 Encounter for screening mammogram for malignant neoplasm of breast: Secondary | ICD-10-CM

## 2023-07-02 ENCOUNTER — Ambulatory Visit
Admission: RE | Admit: 2023-07-02 | Discharge: 2023-07-02 | Disposition: A | Discharge status: Home / Self Care | Source: Ambulatory Visit | Attending: Internal Medicine | Admitting: Internal Medicine

## 2023-07-02 DIAGNOSIS — Z1231 Encounter for screening mammogram for malignant neoplasm of breast: Secondary | ICD-10-CM

## 2023-07-07 NOTE — Progress Notes (Signed)
 [name] postmenopausal female with mild osteopenia here for annual exam-low risk Medicare exam. Married.  Retired Runner, broadcasting/film/video, now works part-time as a Designer, multimedia.  She reports urinary frequency and urgency.  Postmenopausal bleeding: none Pelvic discharge or pain: none Breast mass, nipple discharge or skin changes : none Sexually active: No   Last PAP: 08/06/17 No results found for: DIAGPAP, HPVHIGH, ADEQPAP Last mammogram: 07/02/23 BIRADS 1, density a Last DXA: 05/25/07 T-score -1.1, mild osteopenia Last colonoscopy: 09/05/15 every 10 years  Exercising: Walks 2 miles daily Smoker:No      GYN HISTORY: No significant history  OB History  Gravida Para Term Preterm AB Living  4 2   2 2   SAB IAB Ectopic Multiple Live Births    2      # Outcome Date GA Lbr Len/2nd Weight Sex Type Anes PTL Lv  4 Ectopic           3 Ectopic           2 Para           1 Para            Past Medical History:  Diagnosis Date   Complication of anesthesia    awareness during beginning of right wrist surgery   GERD (gastroesophageal reflux disease)    Headache    Past Surgical History:  Procedure Laterality Date   APPENDECTOMY  68 years old   BREAST BIOPSY  2005   BREAST EXCISIONAL BIOPSY Right    COLONOSCOPY WITH PROPOFOL  N/A 09/05/2015   Procedure: COLONOSCOPY WITH PROPOFOL ;  Surgeon: Christina MARLA Louder, MD;  Location: [facility];  Service: Endoscopy;  Laterality: N/A;   colonscopy     ECTOPIC PREGNANCY SURGERY     X 2   FOOT SURGERY     left   PELVIC LAPAROSCOPY     ectopic pregnacies x 2   WRIST SURGERY     right   Current Outpatient Medications on File Prior to Visit  Medication Sig Dispense Refill   Calcium Carbonate (CALCIUM 600 PO) Take 1 tablet by mouth daily.     cholecalciferol (VITAMIN D) 1000 units tablet Take 1,000 Units by mouth daily.     clotrimazole (LOTRIMIN) 1 % cream Apply 1 application topically 2 (two) times daily as needed (athletes foot).      famotidine (PEPCID) 20 MG tablet Take 20 mg by mouth as needed for heartburn or indigestion.     fexofenadine (ALLEGRA ALLERGY) 180 MG tablet 1 tablet     No current facility-administered medications on file prior to visit.   Social History   Socioeconomic History   Marital status: Married    Spouse name: Not on file   Number of children: Not on file   Years of education: Not on file   Highest education level: Not on file  Occupational History   Not on file  Tobacco Use   Smoking status: Never   Smokeless tobacco: Never  Vaping Use   Vaping status: Never Used  Substance and Sexual Activity   Alcohol use: No    Alcohol/week: 0.0 standard drinks of alcohol   Drug use: No   Sexual activity: Not Currently    Birth control/protection: Post-menopausal    Comment: 1st intercourse 9 yr.-Fewer than 5 partners  Other Topics Concern   Not on file  Social History Narrative   Not on file   Social Drivers of Health   Financial Resource Strain: Not on file  Food Insecurity: Not on file  Transportation Needs: Not on file  Physical Activity: Not on file  Stress: Not on file  Social Connections: Not on file  Intimate Partner Violence: Not on file   Family History  Problem Relation Age of Onset   Diabetes Father    Hypertension Father    Heart disease Father    Cancer Mother 16       leukemia   Breast cancer Mother 6   Rheum arthritis Sister    Allergies  Allergen Reactions   Sulfa Antibiotics     felt hot inside      PE Today's Vitals   07/08/23 0759  BP: 110/80  Pulse: 68  Temp: 98.6 F (37 C)  TempSrc: Oral  SpO2: 98%  Weight: 178 lb (80.7 kg)  Height: 5' 6.25 (1.683 m)   Body mass index is 28.51 kg/m.  Physical Exam Vitals reviewed. Exam conducted with a chaperone present.  Constitutional:      General: She is not in acute distress.    Appearance: Normal appearance.  HENT:     Head: Normocephalic and atraumatic.     Nose: Nose normal.   Eyes:      Extraocular Movements: Extraocular movements intact.     Conjunctiva/sclera: Conjunctivae normal.   Neck:     Thyroid: No thyroid mass, thyromegaly or thyroid tenderness.  Pulmonary:     Effort: Pulmonary effort is normal.  Chest:     Chest wall: No mass or tenderness.  Breasts:    Right: Normal. No swelling, mass, nipple discharge, skin change or tenderness.     Left: Normal. No swelling, mass, nipple discharge, skin change or tenderness.  Abdominal:     General: There is no distension.     Palpations: Abdomen is soft.     Tenderness: There is no abdominal tenderness.  Genitourinary:    General: Normal vulva.     Exam position: Lithotomy position.     Urethra: No prolapse.     Vagina: Normal. No vaginal discharge or bleeding.     Cervix: Normal. No lesion.     Uterus: Normal. Not enlarged and not tender.      Adnexa: Right adnexa normal and left adnexa normal.     Comments: Kegel 2/5  Musculoskeletal:        General: Normal range of motion.     Cervical back: Normal range of motion.  Lymphadenopathy:     Upper Body:     Right upper body: No axillary adenopathy.     Left upper body: No axillary adenopathy.     Lower Body: No right inguinal adenopathy. No left inguinal adenopathy.   Skin:    General: Skin is warm and dry.   Neurological:     General: No focal deficit present.     Mental Status: She is alert.   Psychiatric:        Mood and Affect: Mood normal.        Behavior: Behavior normal.      Assessment and Plan:        Encounter for breast and pelvic examination Assessment & Plan: Cervical cancer screening performed according to ASCCP guidelines. Encouraged annual mammogram screening Colonoscopy UTD DXA , 2023, repeat 2-25yr Labs and immunizations with her primary Encouraged safe sexual practices as indicated Encouraged healthy lifestyle practices with diet and exercise For patients under 50-70yo, I recommend 1200mg  calcium daily and 600IU of vitamin  D daily.  Urinary urgency Urinary frequency -  Urinalysis,Complete w/RFL Culture Recommend daily Kegels and glue bridges. Discussed OAB medications, patient declines at this time.  Cervical cancer screening -     Cytology - PAP  Genesis LULLA Pa, MD

## 2023-07-08 ENCOUNTER — Ambulatory Visit (INDEPENDENT_AMBULATORY_CARE_PROVIDER_SITE_OTHER): Admitting: Obstetrics and Gynecology

## 2023-07-08 ENCOUNTER — Encounter: Payer: Self-pay | Admitting: Obstetrics and Gynecology

## 2023-07-08 ENCOUNTER — Other Ambulatory Visit (HOSPITAL_COMMUNITY)
Admission: RE | Admit: 2023-07-08 | Discharge: 2023-07-08 | Disposition: A | Discharge status: Home / Self Care | Source: Ambulatory Visit | Attending: Obstetrics and Gynecology | Admitting: Obstetrics and Gynecology

## 2023-07-08 VITALS — BP 110/80 | HR 68 | Temp 98.6°F | Ht 66.25 in | Wt 178.0 lb

## 2023-07-08 DIAGNOSIS — R350 Frequency of micturition: Secondary | ICD-10-CM

## 2023-07-08 DIAGNOSIS — Z01419 Encounter for gynecological examination (general) (routine) without abnormal findings: Secondary | ICD-10-CM | POA: Insufficient documentation

## 2023-07-08 DIAGNOSIS — Z1331 Encounter for screening for depression: Secondary | ICD-10-CM | POA: Diagnosis not present

## 2023-07-08 DIAGNOSIS — Z1151 Encounter for screening for human papillomavirus (HPV): Secondary | ICD-10-CM | POA: Diagnosis not present

## 2023-07-08 DIAGNOSIS — R3915 Urgency of urination: Secondary | ICD-10-CM | POA: Diagnosis not present

## 2023-07-08 DIAGNOSIS — Z124 Encounter for screening for malignant neoplasm of cervix: Secondary | ICD-10-CM

## 2023-07-08 NOTE — Patient Instructions (Signed)

## 2023-07-08 NOTE — Assessment & Plan Note (Signed)
 Cervical cancer screening performed according to ASCCP guidelines. Encouraged annual mammogram screening Colonoscopy UTD DXA , 2023, repeat 2-28yr Labs and immunizations with her primary Encouraged safe sexual practices as indicated Encouraged healthy lifestyle practices with diet and exercise For patients under 50-68yo, I recommend 1200mg  calcium daily and 600IU of vitamin D daily.

## 2023-07-10 ENCOUNTER — Ambulatory Visit: Payer: Self-pay | Admitting: Obstetrics and Gynecology

## 2023-07-10 LAB — URINALYSIS, COMPLETE W/RFL CULTURE
Bacteria, UA: NONE SEEN /HPF
Bilirubin Urine: NEGATIVE
Glucose, UA: NEGATIVE
Hyaline Cast: NONE SEEN /LPF
Ketones, ur: NEGATIVE
Leukocyte Esterase: NEGATIVE
Nitrites, Initial: NEGATIVE
Protein, ur: NEGATIVE
Specific Gravity, Urine: 1.003 (ref 1.001–1.035)
pH: 6 (ref 5.0–8.0)

## 2023-07-10 LAB — URINE CULTURE
MICRO NUMBER:: 16618041
SPECIMEN QUALITY:: ADEQUATE

## 2023-07-10 LAB — CULTURE INDICATED

## 2023-07-11 LAB — CYTOLOGY - PAP
Comment: NEGATIVE
Comment: NEGATIVE
Comment: NEGATIVE
Diagnosis: NEGATIVE
HPV 16: NEGATIVE
HPV 18 / 45: NEGATIVE
High risk HPV: POSITIVE — AB

## 2023-07-14 ENCOUNTER — Encounter: Payer: Self-pay | Admitting: Obstetrics and Gynecology

## 2023-07-14 ENCOUNTER — Ambulatory Visit (INDEPENDENT_AMBULATORY_CARE_PROVIDER_SITE_OTHER): Admitting: Obstetrics and Gynecology

## 2023-07-14 VITALS — BP 140/62 | HR 60 | Temp 98.4°F | Wt 176.0 lb

## 2023-07-14 DIAGNOSIS — N72 Inflammatory disease of cervix uteri: Secondary | ICD-10-CM | POA: Diagnosis not present

## 2023-07-14 DIAGNOSIS — B977 Papillomavirus as the cause of diseases classified elsewhere: Secondary | ICD-10-CM | POA: Insufficient documentation

## 2023-07-14 NOTE — Progress Notes (Signed)
 [name] postmenopausal female with mild osteopenia here for results discussion. Married x61yr.  Retired Runner, broadcasting/film/video, now works part-time as a Designer, multimedia.  She has not been sexually active for at least 10 years due to ED. Questions about treatment including hysterectomy.  Last PAP:     Component Value Date/Time   DIAGPAP  07/08/2023 0838    - Negative for intraepithelial lesion or malignancy (NILM)   HPVHIGH Positive (A) 07/08/2023 0838   ADEQPAP  07/08/2023 0838    Satisfactory for evaluation; transformation zone component PRESENT.   Smoker:No    GYN HISTORY: No significant history  OB History  Gravida Para Term Preterm AB Living  4 2   2 2   SAB IAB Ectopic Multiple Live Births    2      # Outcome Date GA Lbr Len/2nd Weight Sex Type Anes PTL Lv  4 Ectopic           3 Ectopic           2 Para           1 Para            Past Medical History:  Diagnosis Date   Complication of anesthesia    awareness during beginning of right wrist surgery   GERD (gastroesophageal reflux disease)    Headache    Past Surgical History:  Procedure Laterality Date   APPENDECTOMY  68 years old   BREAST BIOPSY  2005   BREAST EXCISIONAL BIOPSY Right    COLONOSCOPY WITH PROPOFOL  N/A 09/05/2015   Procedure: COLONOSCOPY WITH PROPOFOL ;  Surgeon: Gladis MARLA Louder, MD;  Location: [facility];  Service: Endoscopy;  Laterality: N/A;   colonscopy     ECTOPIC PREGNANCY SURGERY     X 2   FOOT SURGERY     left   PELVIC LAPAROSCOPY     ectopic pregnacies x 2   WRIST SURGERY     right   Current Outpatient Medications on File Prior to Visit  Medication Sig Dispense Refill   Calcium Carbonate (CALCIUM 600 PO) Take 1 tablet by mouth daily.     cholecalciferol (VITAMIN D) 1000 units tablet Take 1,000 Units by mouth daily.     clotrimazole (LOTRIMIN) 1 % cream Apply 1 application topically 2 (two) times daily as needed (athletes foot).     famotidine (PEPCID) 20 MG tablet Take 20 mg by mouth as  needed for heartburn or indigestion.     fexofenadine (ALLEGRA ALLERGY) 180 MG tablet 1 tablet     No current facility-administered medications on file prior to visit.   Allergies  Allergen Reactions   Sulfa Antibiotics     felt hot inside    PE Today's Vitals   07/14/23 1426  BP: (!) 140/62  Pulse: 60  Temp: 98.4 F (36.9 C)  TempSrc: Oral  SpO2: 99%  Weight: 176 lb (79.8 kg)   Body mass index is 28.19 kg/m.  Physical Exam Vitals reviewed.  Constitutional:      General: She is not in acute distress.    Appearance: Normal appearance.  HENT:     Head: Normocephalic and atraumatic.     Nose: Nose normal.   Eyes:     Extraocular Movements: Extraocular movements intact.     Conjunctiva/sclera: Conjunctivae normal.   Pulmonary:     Effort: Pulmonary effort is normal.   Musculoskeletal:        General: Normal range of motion.  Cervical back: Normal range of motion.   Neurological:     General: No focal deficit present.     Mental Status: She is alert.   Psychiatric:        Mood and Affect: Mood normal.        Behavior: Behavior normal.      Assessment and Plan:        High risk human papilloma virus (HPV) infection of cervix  Reviewed HPV infection in association with cervical cancer. Reviewed ASCCP guidelines. Recommendations for cotest in 1 year were made based off of guidelines. Reviewed risk of surgical treatment like hysterectomy and likelihood of HPV regression on PAP. I also recommend avoidance of smoking, healthy diet, regular sleep and stress reduction techniques. ACOG pamphlet provided. All questions answered.  22 min  total time was spent for this patient encounter, including preparation, face-to-face counseling with the patient, coordination of care, and documentation of the encounter.   Christina LULLA Pa, MD

## 2023-08-13 DIAGNOSIS — R7303 Prediabetes: Secondary | ICD-10-CM | POA: Diagnosis not present

## 2023-08-13 DIAGNOSIS — E559 Vitamin D deficiency, unspecified: Secondary | ICD-10-CM | POA: Diagnosis not present

## 2023-08-13 DIAGNOSIS — Z1331 Encounter for screening for depression: Secondary | ICD-10-CM | POA: Diagnosis not present

## 2023-08-13 DIAGNOSIS — M179 Osteoarthritis of knee, unspecified: Secondary | ICD-10-CM | POA: Diagnosis not present

## 2023-08-13 DIAGNOSIS — Z136 Encounter for screening for cardiovascular disorders: Secondary | ICD-10-CM | POA: Diagnosis not present

## 2023-08-13 DIAGNOSIS — K219 Gastro-esophageal reflux disease without esophagitis: Secondary | ICD-10-CM | POA: Diagnosis not present

## 2023-08-13 DIAGNOSIS — Z0000 Encounter for general adult medical examination without abnormal findings: Secondary | ICD-10-CM | POA: Diagnosis not present

## 2023-08-13 DIAGNOSIS — I10 Essential (primary) hypertension: Secondary | ICD-10-CM | POA: Diagnosis not present

## 2023-08-13 DIAGNOSIS — J309 Allergic rhinitis, unspecified: Secondary | ICD-10-CM | POA: Diagnosis not present

## 2023-09-10 DIAGNOSIS — Z658 Other specified problems related to psychosocial circumstances: Secondary | ICD-10-CM | POA: Diagnosis not present

## 2023-09-10 DIAGNOSIS — I10 Essential (primary) hypertension: Secondary | ICD-10-CM | POA: Diagnosis not present

## 2023-10-03 IMAGING — MG MM DIGITAL SCREENING BILAT W/ TOMO AND CAD
8 series · 8 of 24 positions shown · non-contrast
Comparison: Previous exam(s).

ACR Breast Density Category a: The breast tissue is almost entirely
fatty.

CLINICAL DATA: Screening.

EXAM:
DIGITAL SCREENING BILATERAL MAMMOGRAM WITH TOMOSYNTHESIS AND CAD
TECHNIQUE: Bilateral screening digital craniocaudal and mediolateral oblique
mammograms were obtained. Bilateral screening digital breast
tomosynthesis was performed. The images were evaluated with
computer-aided detection.

[R MLO synth-2D]
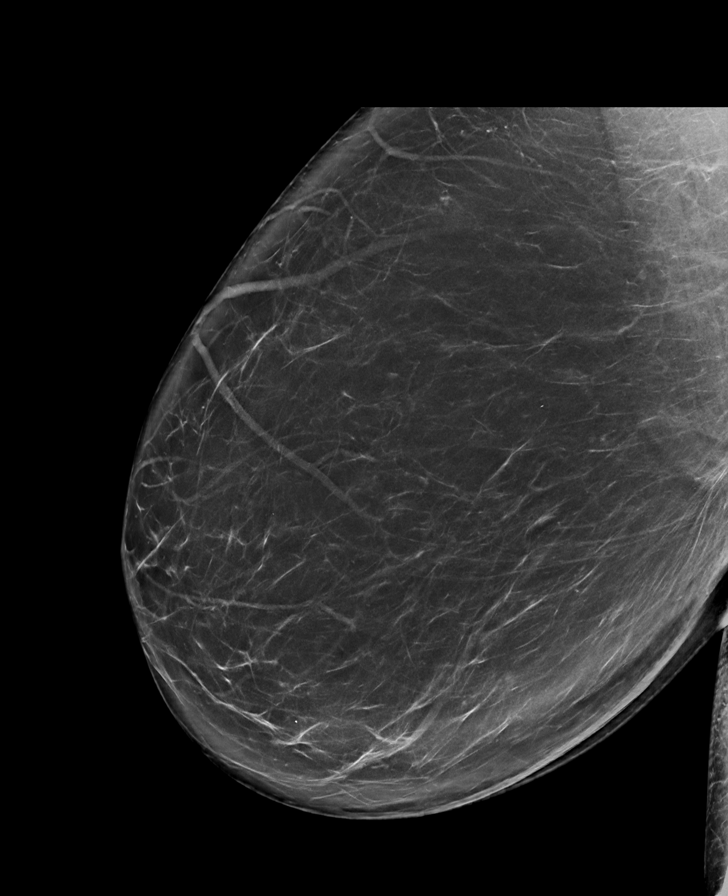

[L CC synth-2D]
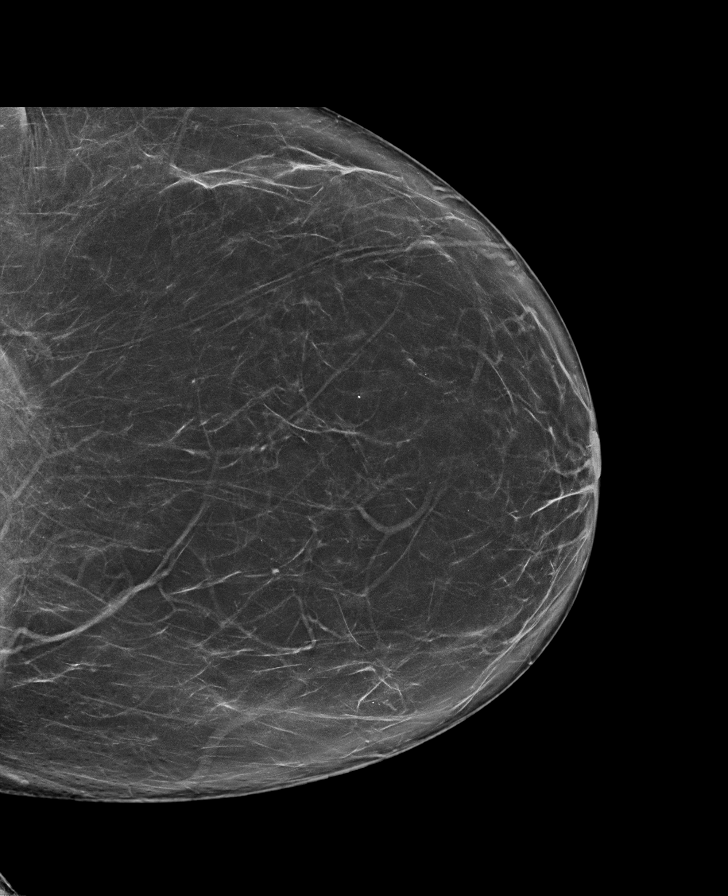

[L MLO synth-2D]
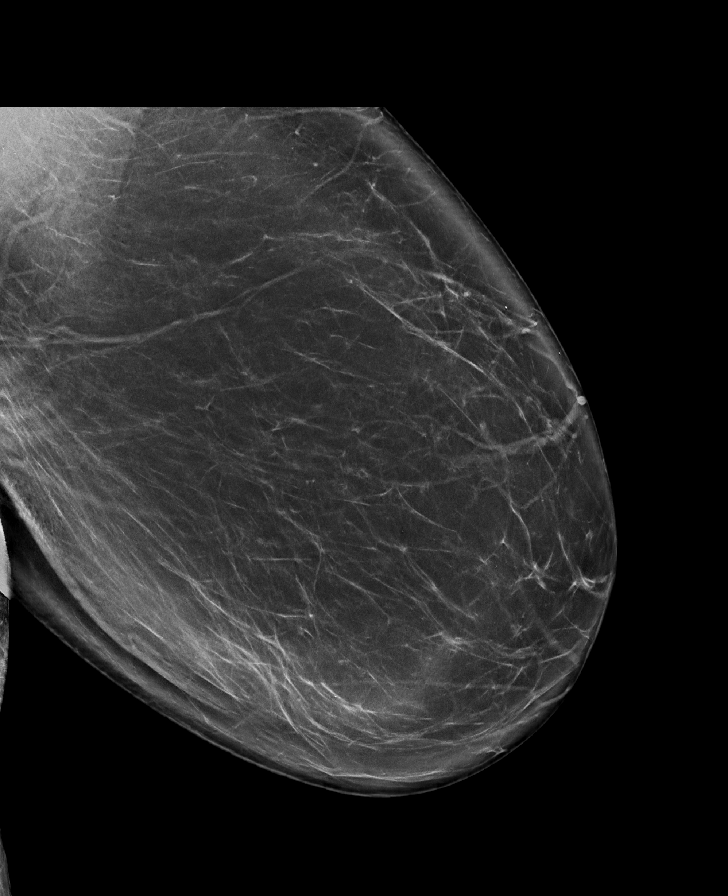

[R CC synth-2D]
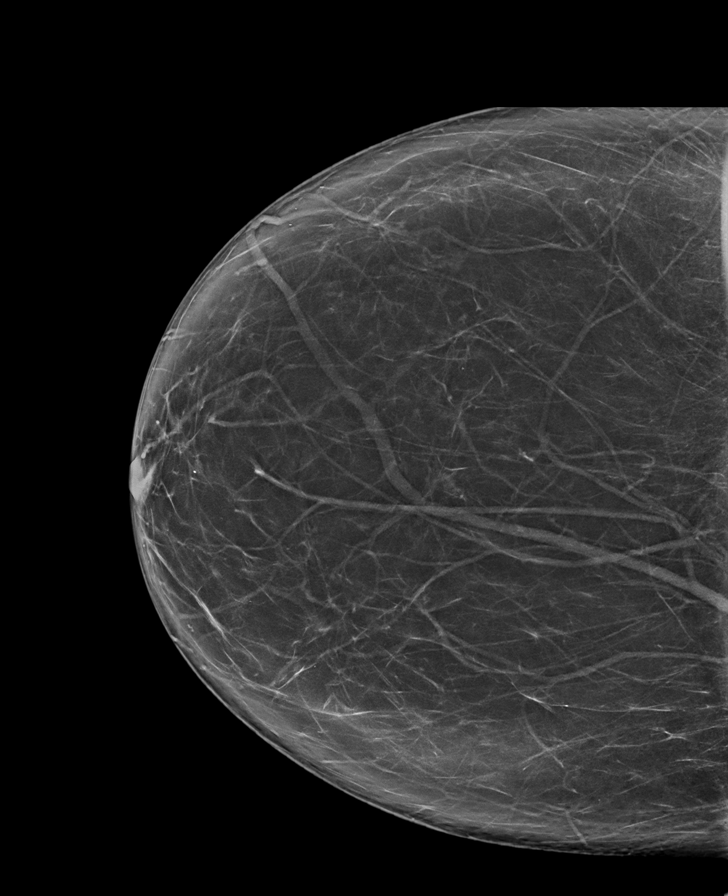

[R CC tomo · tomo slice 47/94.0]
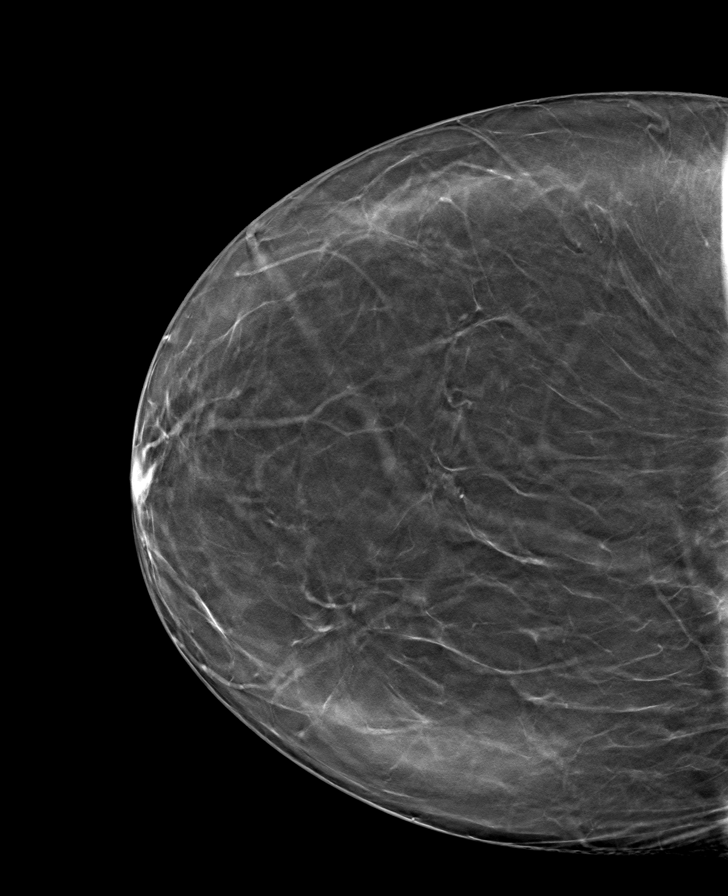

[L CC tomo · tomo slice 45/90.0]
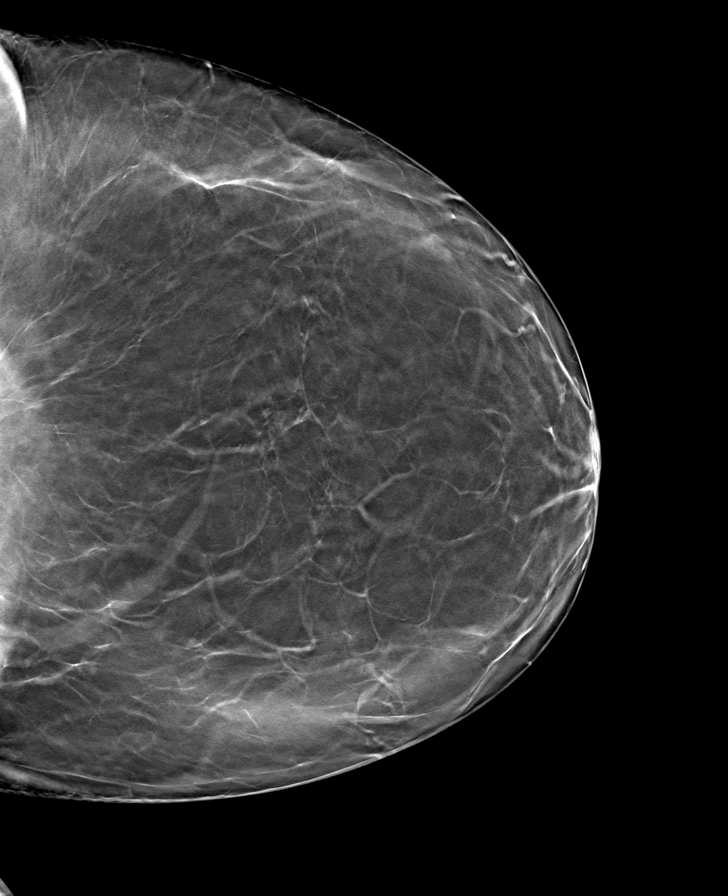

[R MLO tomo · tomo slice 50/99.0]
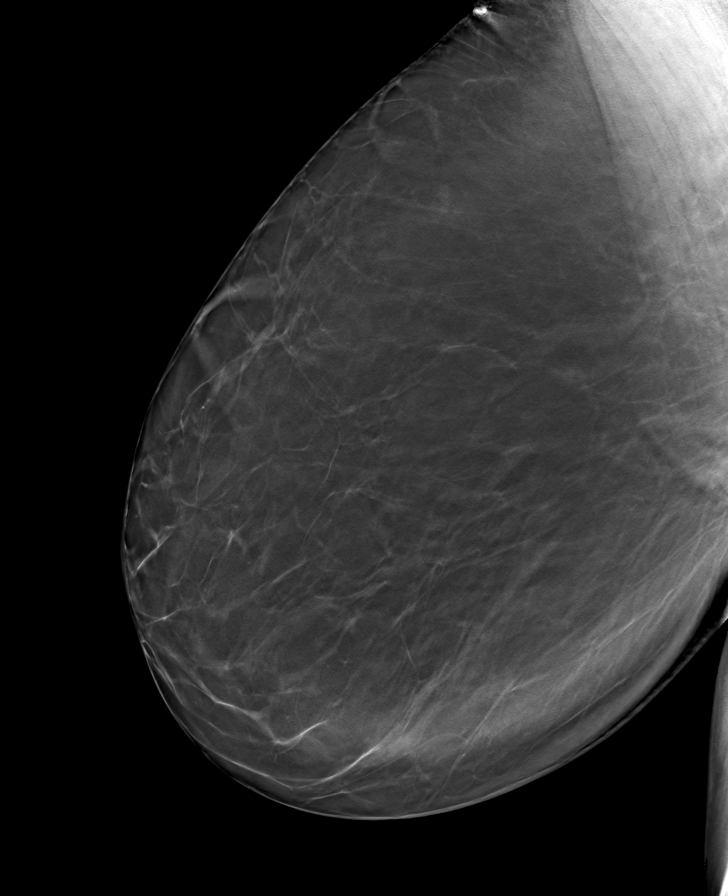

[L MLO tomo · tomo slice 51/101.0]
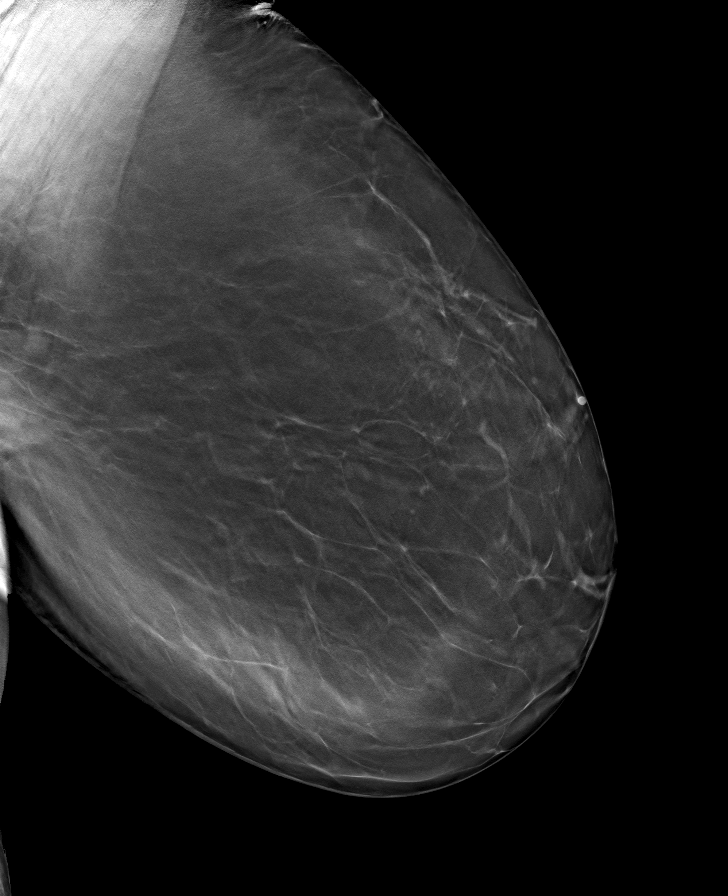

[8 of 24 positions shown; findings below may reference images not displayed]

FINDINGS: There are no findings suspicious for malignancy.
IMPRESSION: No mammographic evidence of malignancy. A result letter of this
screening mammogram will be mailed directly to the patient.

RECOMMENDATION:
Screening mammogram in one year. (Code:0E-3-N98)

BI-RADS CATEGORY  1: Negative.

## 2023-11-14 DIAGNOSIS — R202 Paresthesia of skin: Secondary | ICD-10-CM | POA: Diagnosis not present

## 2023-11-14 DIAGNOSIS — R7303 Prediabetes: Secondary | ICD-10-CM | POA: Diagnosis not present

## 2023-11-14 DIAGNOSIS — I10 Essential (primary) hypertension: Secondary | ICD-10-CM | POA: Diagnosis not present

## 2024-07-15 ENCOUNTER — Ambulatory Visit: Admitting: Obstetrics and Gynecology
